# Patient Record
Sex: Male | Born: 1940 | Race: White | Hispanic: No | Marital: Married | Smoking: Never smoker
Health system: Southern US, Community
[De-identification: ages and names within clinical notes are randomized; demographics above are authoritative.]

## PROBLEM LIST (undated history)

## (undated) DIAGNOSIS — C801 Malignant (primary) neoplasm, unspecified: Secondary | ICD-10-CM

## (undated) DIAGNOSIS — Z972 Presence of dental prosthetic device (complete) (partial): Secondary | ICD-10-CM

## (undated) DIAGNOSIS — R531 Weakness: Secondary | ICD-10-CM

## (undated) DIAGNOSIS — Z8669 Personal history of other diseases of the nervous system and sense organs: Secondary | ICD-10-CM

## (undated) DIAGNOSIS — H9190 Unspecified hearing loss, unspecified ear: Secondary | ICD-10-CM

## (undated) DIAGNOSIS — Z974 Presence of external hearing-aid: Secondary | ICD-10-CM

## (undated) DIAGNOSIS — K219 Gastro-esophageal reflux disease without esophagitis: Secondary | ICD-10-CM

## (undated) DIAGNOSIS — Z86011 Personal history of benign neoplasm of the brain: Secondary | ICD-10-CM

## (undated) DIAGNOSIS — K2270 Barrett's esophagus without dysplasia: Secondary | ICD-10-CM

## (undated) DIAGNOSIS — K635 Polyp of colon: Secondary | ICD-10-CM

## (undated) DIAGNOSIS — R413 Other amnesia: Secondary | ICD-10-CM

## (undated) DIAGNOSIS — M1990 Unspecified osteoarthritis, unspecified site: Secondary | ICD-10-CM

## (undated) DIAGNOSIS — L570 Actinic keratosis: Secondary | ICD-10-CM

## (undated) DIAGNOSIS — I10 Essential (primary) hypertension: Secondary | ICD-10-CM

## (undated) DIAGNOSIS — H9319 Tinnitus, unspecified ear: Secondary | ICD-10-CM

## (undated) HISTORY — PX: BRAIN SURGERY: SHX531

## (undated) HISTORY — PX: PROSTATE SURGERY: SHX751

## (undated) HISTORY — PX: TOTAL KNEE ARTHROPLASTY: SHX125

## (undated) HISTORY — PX: EYE SURGERY: SHX253

---

## 2007-09-18 ENCOUNTER — Ambulatory Visit: Payer: Self-pay | Admitting: Internal Medicine

## 2007-10-02 ENCOUNTER — Ambulatory Visit: Payer: Self-pay | Admitting: Family Medicine

## 2013-06-14 DIAGNOSIS — D320 Benign neoplasm of cerebral meninges: Secondary | ICD-10-CM | POA: Insufficient documentation

## 2013-06-14 DIAGNOSIS — I10 Essential (primary) hypertension: Secondary | ICD-10-CM | POA: Insufficient documentation

## 2013-06-14 DIAGNOSIS — M1710 Unilateral primary osteoarthritis, unspecified knee: Secondary | ICD-10-CM | POA: Insufficient documentation

## 2013-06-14 DIAGNOSIS — K635 Polyp of colon: Secondary | ICD-10-CM | POA: Insufficient documentation

## 2013-06-14 DIAGNOSIS — C61 Malignant neoplasm of prostate: Secondary | ICD-10-CM | POA: Insufficient documentation

## 2013-06-14 DIAGNOSIS — K2270 Barrett's esophagus without dysplasia: Secondary | ICD-10-CM | POA: Insufficient documentation

## 2013-08-23 DIAGNOSIS — R7301 Impaired fasting glucose: Secondary | ICD-10-CM | POA: Insufficient documentation

## 2013-08-23 DIAGNOSIS — E785 Hyperlipidemia, unspecified: Secondary | ICD-10-CM | POA: Insufficient documentation

## 2013-08-23 DIAGNOSIS — Z0000 Encounter for general adult medical examination without abnormal findings: Secondary | ICD-10-CM | POA: Insufficient documentation

## 2014-02-21 DIAGNOSIS — J309 Allergic rhinitis, unspecified: Secondary | ICD-10-CM | POA: Insufficient documentation

## 2016-09-09 DIAGNOSIS — K2100 Gastro-esophageal reflux disease with esophagitis, without bleeding: Secondary | ICD-10-CM | POA: Insufficient documentation

## 2017-07-05 DIAGNOSIS — Z86018 Personal history of other benign neoplasm: Secondary | ICD-10-CM | POA: Insufficient documentation

## 2020-01-28 DIAGNOSIS — I639 Cerebral infarction, unspecified: Secondary | ICD-10-CM

## 2020-01-28 HISTORY — DX: Cerebral infarction, unspecified: I63.9

## 2020-04-12 DIAGNOSIS — R399 Unspecified symptoms and signs involving the genitourinary system: Secondary | ICD-10-CM | POA: Insufficient documentation

## 2020-06-28 DIAGNOSIS — I6930 Unspecified sequelae of cerebral infarction: Secondary | ICD-10-CM | POA: Insufficient documentation

## 2020-10-30 DIAGNOSIS — H539 Unspecified visual disturbance: Secondary | ICD-10-CM | POA: Insufficient documentation

## 2020-10-30 DIAGNOSIS — F329 Major depressive disorder, single episode, unspecified: Secondary | ICD-10-CM | POA: Insufficient documentation

## 2020-10-30 DIAGNOSIS — F064 Anxiety disorder due to known physiological condition: Secondary | ICD-10-CM | POA: Insufficient documentation

## 2021-03-27 ENCOUNTER — Ambulatory Visit
Admission: EM | Admit: 2021-03-27 | Discharge: 2021-03-27 | Disposition: A | Discharge status: Home / Self Care | Payer: Medicare HMO | Attending: Family Medicine | Admitting: Family Medicine

## 2021-03-27 ENCOUNTER — Encounter: Payer: Self-pay | Admitting: Emergency Medicine

## 2021-03-27 ENCOUNTER — Other Ambulatory Visit: Payer: Self-pay

## 2021-03-27 DIAGNOSIS — K4090 Unilateral inguinal hernia, without obstruction or gangrene, not specified as recurrent: Secondary | ICD-10-CM | POA: Diagnosis not present

## 2021-03-27 NOTE — ED Provider Notes (Signed)
[facility]    CSN: [id number] Arrival date & time: 03/27/21  1934  History   Chief Complaint Chief Complaint  Patient presents with   Groin Pain    HPI 80 year old male presents with the above complaint.  Patient reports intermittent swelling and pain of the left groin. Wife states that he complained of pain today after doing yard work. No fall, trauma or injury. Denies any fever or other associated symptoms.   Home Medications    Prior to Admission medications   Medication Sig Start Date End Date Taking? Authorizing Provider  escitalopram (LEXAPRO) 10 MG tablet Take 1 tablet by mouth daily. 12/18/20  Yes [provider]  finasteride (PROSCAR) 5 MG tablet  08/16/20  Yes [provider]  lisinopril (ZESTRIL) 40 MG tablet Take 1 tablet by mouth daily. 11/27/20  Yes [provider]  metFORMIN (GLUCOPHAGE-XR) 750 MG 24 hr tablet Take by mouth. 08/27/20  Yes [provider]  simvastatin (ZOCOR) 40 MG tablet Take 1 tablet by mouth at bedtime. 03/30/15  Yes [provider]  solifenacin (VESICARE) 5 MG tablet Take by mouth. 11/26/20  Yes [provider]  Cholecalciferol 25 MCG (1000 UT) tablet Take by mouth.    [provider]  cyanocobalamin 1000 MCG tablet Take by mouth.    [provider]  omeprazole (PRILOSEC) 20 MG capsule Take by mouth.    [provider]   Social History Social History   Tobacco Use   Smoking status: Never   Smokeless tobacco: Never  Vaping Use   Vaping Use: Never used  Substance Use Topics   Alcohol use: Not Currently   Drug use: Not Currently     Allergies   Codeine   Review of Systems Review of Systems Per HPI  Physical Exam Triage Vital Signs ED Triage Vitals  Enc Vitals Group     BP 03/27/21 1951 119/82     Pulse Rate 03/27/21 1951 68     Resp 03/27/21 1951 18     Temp 03/27/21 1951 98.1 F (36.7 C)     Temp Source 03/27/21 1951 Oral     SpO2  03/27/21 1951 96 %     Weight 03/27/21 1948 170 lb (77.1 kg)     Height --      Head Circumference --      Peak Flow --      Pain Score 03/27/21 1948 0     Pain Loc --      Pain Edu? --      Excl. in [place]? --    Updated Vital Signs BP 119/82 (BP Location: Left Arm)   Pulse 68   Temp 98.1 F (36.7 C) (Oral)   Resp 18   Wt 77.1 kg   SpO2 96%   Visual Acuity Right Eye Distance:   Left Eye Distance:   Bilateral Distance:    Right Eye Near:   Left Eye Near:    Bilateral Near:     Physical Exam Vitals and nursing note reviewed.  Constitutional:      General: He is not in acute distress.    Appearance: Normal appearance.  HENT:     Head: Normocephalic and atraumatic.  Pulmonary:     Effort: Pulmonary effort is normal. No respiratory distress.  Genitourinary:    Comments: Reducible left inguinal hernia. Neurological:     Mental Status: He is alert.  Psychiatric:        Mood and Affect:  Mood normal.        Behavior: Behavior normal.     UC Treatments / Results  Labs (all labs ordered are listed, but only abnormal results are displayed) Labs Reviewed - No data to display  EKG   Radiology No results found.  Procedures Procedures (including critical care time)  Medications Ordered in UC Medications - No data to display  Initial Impression / Assessment and Plan / UC Course  I have reviewed the triage vital signs and the nursing notes.  Pertinent labs & imaging results that were available during my care of the patient were reviewed by me and considered in my medical decision making (see chart for details).    80 year old male presents with a left inguinal hernia. Referring to general surgery.   Final Clinical Impressions(s) / UC Diagnoses   Final diagnoses:  Reducible left inguinal hernia     Discharge Instructions      I have put a referral to general surgery.  No heavy lifting.   Take it easy!  Dr. Lacinda Axon    ED Prescriptions   None     PDMP not reviewed this encounter.   Coral Spikes, [place] 03/27/21 2256

## 2021-03-27 NOTE — Discharge Instructions (Addendum)
I have put a referral to general surgery.  No heavy lifting.   Take it easy!  Dr. Lacinda Axon

## 2021-03-27 NOTE — ED Triage Notes (Signed)
Pt presents with his wife with c/o of groin pain intermittently x 3 months. Denies dysuria or n/vd.

## 2021-04-02 ENCOUNTER — Telehealth: Payer: Self-pay | Admitting: Surgery

## 2021-04-02 ENCOUNTER — Other Ambulatory Visit: Payer: Self-pay

## 2021-04-02 ENCOUNTER — Ambulatory Visit (INDEPENDENT_AMBULATORY_CARE_PROVIDER_SITE_OTHER): Payer: Medicare HMO | Admitting: Surgery

## 2021-04-02 ENCOUNTER — Ambulatory Visit: Payer: Self-pay | Admitting: Surgery

## 2021-04-02 ENCOUNTER — Encounter: Payer: Self-pay | Admitting: Surgery

## 2021-04-02 VITALS — BP 147/77 | HR 71 | Temp 98.4°F | Ht 67.0 in | Wt 168.8 lb

## 2021-04-02 DIAGNOSIS — K4090 Unilateral inguinal hernia, without obstruction or gangrene, not specified as recurrent: Secondary | ICD-10-CM

## 2021-04-02 NOTE — Telephone Encounter (Signed)
Outgoing call is made, spoke with wife, Festus Holts. They have been advised of Pre-Admission date/time, COVID Testing date and Surgery date.  Surgery Date: 04/10/21 Preadmission Testing Date: 04/04/21 (phone 8a-1p) Covid Testing Date: Not needed.    Patient has been made aware to call [phone], between 1-3:00pm the day before surgery, to find out what time to arrive for surgery.

## 2021-04-02 NOTE — H&P (View-Only) (Signed)
Patient ID: Jamie Mccoy, male   DOB: [birth year], 80 y.o.   MRN: [id number]  Chief Complaint: Left inguinal hernia  History of Present Illness Jamie Mccoy is a 80 y.o. male with a left groin bulge noted earlier this year, getting progressively larger.  Seems to be exacerbated by activity, standing, mowing, lifting.  Has pain at times.  Pain improves with recumbent positioning.  Denies any history of nausea, vomiting, fevers chills or changes in bowel activity.  Past Medical History Past Medical History:  Diagnosis Date   Stroke ([place]) 01/2020      Past Surgical History:  Procedure Laterality Date   BRAIN SURGERY     EYE SURGERY     PROSTATE SURGERY     TOTAL KNEE ARTHROPLASTY Left     Allergies  Allergen Reactions   Codeine Other (See Comments)    confusion    Current Outpatient Medications  Medication Sig Dispense Refill   Cholecalciferol 25 MCG (1000 UT) tablet Take by mouth.     cyanocobalamin 1000 MCG tablet Take by mouth.     escitalopram (LEXAPRO) 10 MG tablet Take 1 tablet by mouth daily.     finasteride (PROSCAR) 5 MG tablet      lisinopril (ZESTRIL) 40 MG tablet Take 1 tablet by mouth daily.     metFORMIN (GLUCOPHAGE-XR) 750 MG 24 hr tablet Take by mouth.     omeprazole (PRILOSEC) 20 MG capsule Take by mouth.     simvastatin (ZOCOR) 40 MG tablet Take 1 tablet by mouth at bedtime.     solifenacin (VESICARE) 5 MG tablet Take by mouth.     No current facility-administered medications for this visit.    Family History History reviewed. No pertinent family history.    Social History Social History   Tobacco Use   Smoking status: Never   Smokeless tobacco: Never  Vaping Use   Vaping Use: Never used  Substance Use Topics   Alcohol use: Not Currently   Drug use: Not Currently        Review of Systems  Constitutional: Negative.   HENT:  Positive for hearing loss.   Eyes: Negative.   Respiratory: Negative.    Cardiovascular: Negative.    Gastrointestinal: Negative.   Genitourinary: Negative.   Skin: Negative.   Neurological:  Positive for speech change.  Psychiatric/Behavioral:  Positive for depression.      Physical Exam Blood pressure (!) 147/77, pulse 71, temperature 98.4 F (36.9 C), temperature source Oral, height 5\' 7"  (1.702 m), weight 168 lb 12.8 oz (76.6 kg), SpO2 97 %. Last Weight  Most recent update: 04/02/2021 10:18 AM    Weight  76.6 kg (168 lb 12.8 oz)             CONSTITUTIONAL: Well developed, and nourished, appropriately responsive and aware without distress.   EYES: Sclera non-icteric.   EARS, NOSE, MOUTH AND THROAT: Mask worn.  There is a well-defined scar of the left parietal scalp.     Hearing is intact to voice.  NECK: Trachea is midline, and there is no jugular venous distension.  LYMPH NODES:  Lymph nodes in the neck are not enlarged. RESPIRATORY:  Lungs are clear, and breath sounds are equal bilaterally. Normal respiratory effort without pathologic use of accessory muscles. CARDIOVASCULAR: Heart is regular in rate and rhythm. GI: The abdomen is without scars, soft, nontender, and nondistended. There were no palpable masses. I did not appreciate hepatosplenomegaly. There were normal bowel sounds. GU: Left inguinal hernia, reducible.  No appreciable right groin bulge. MUSCULOSKELETAL:  Symmetrical muscle tone appreciated in all four extremities.    SKIN: Skin turgor is normal. No pathologic skin lesions appreciated.  NEUROLOGIC:  Motor and sensation appear grossly nonfocal.  He is slightly slow in speech, but appropriate and engaged. PSYCH:  Alert and oriented to person, place and time. Affect is appropriate for situation.  Data Reviewed I have personally reviewed what is currently available of the patient's imaging, recent labs and medical records.   Labs:  No flowsheet data found. No flowsheet data found.    Imaging:  Within last 24 hrs: No results found.  Assessment    Left  inguinal hernia. There are no problems to display for this patient.   Plan    Robotic left inguinal hernia repair. I discussed possibility of incarceration, strangulation, enlargement in size over time, and the need for emergency surgery in the face of these.  Also reviewed the techniques of reduction should incarceration occur, and when unsuccessful to present to the ED.  Also discussed that surgery risks include recurrence which can be up to 30% in the case of complex hernias, use of prosthetic materials (mesh) and the increased risk of infection and the possible need for re-operation and removal of mesh, possibility of post-op SBO or ileus, and the risks of general anesthetic including heart attack, stroke, sudden death or some reaction to anesthetic medications. The patient, and those present, appear to understand the risks, any and all questions were answered to the patient's satisfaction.  No guarantees were ever expressed or implied.   Face-to-face time spent with the patient and accompanying care providers(if present) was 30 minutes, with more than 50% of the time spent counseling, educating, and coordinating care of the patient.    These notes generated with voice recognition software. I apologize for typographical errors.  Ronny Bacon M.D., FACS 04/02/2021, 10:39 AM

## 2021-04-02 NOTE — Patient Instructions (Addendum)
Our surgery scheduler Pamala Hurry will call you within 24-48 hours to get you scheduled. If you have not heard from her after 48 hours, please call our office. You will not need to get Covid tested before surgery and have the blue sheet available when she calls to write down important information.   If you have any concerns or questions, please feel free to call our office.    Laparoscopic Inguinal Hernia Repair, Adult Laparoscopic inguinal hernia repair is a surgical procedure to repair a small, weak spot in the groin muscles that allows fat or intestines from inside the abdomen to bulge out (inguinal hernia). This procedure may be planned, or it may be an emergency procedure. During the procedure, tissue that has bulged out is moved back into place, and the opening in the groin muscles is repaired. This is done through three small incisions in the abdomen. A thin tube with a light and camera on the end (laparoscope) is used to help perform the procedure. Tell a health care provider about: Any allergies you have. All medicines you are taking, including vitamins, herbs, eye drops, creams, and over-the-counter medicines. Any problems you or family members have had with anesthetic medicines. Any blood disorders you have. Any surgeries you have had. Any medical conditions you have. Whether you are pregnant or may be pregnant. What are the risks? Generally, this is a safe procedure. However, problems may occur, including: Infection. Bleeding. Allergic reactions to medicines. Damage to nearby structures or organs. Testicle damage or long-term pain and swelling of the scrotum, in males. Inability to completely empty the bladder (urinary retention). Blood clots. A collection of fluid that builds up under the skin (seroma). The hernia coming back (recurrence). What happens before the procedure? Staying hydrated Follow instructions from your health care provider about hydration, which may  include: Up to 2 hours before the procedure - you may continue to drink clear liquids, such as water, clear fruit juice, black coffee, and plain tea.  Eating and drinking restrictions Follow instructions from your health care provider about eating and drinking, which may include: 8 hours before the procedure - stop eating heavy meals or foods, such as meat, fried foods, or fatty foods. 6 hours before the procedure - stop eating light meals or foods, such as toast or cereal. 6 hours before the procedure - stop drinking milk or drinks that contain milk. 2 hours before the procedure - stop drinking clear liquids. Medicines Ask your health care provider about: Changing or stopping your regular medicines. This is especially important if you are taking diabetes medicines or blood thinners. Taking medicines such as aspirin and ibuprofen. These medicines can thin your blood. Do not take these medicines unless your health care provider tells you to take them. Taking over-the-counter medicines, vitamins, herbs, and supplements. General instructions Do not use any products that contain nicotine or tobacco for at least 4 weeks before the procedure, if possible. These products include cigarettes, chewing tobacco, and vaping devices, such as e-cigarettes. If you need help quitting, ask your health care provider. Ask your health care provider: How your surgery site will be marked. What steps will be taken to help prevent infection. These steps may include: Removing hair at the surgery site. Washing skin with a germ-killing soap. Taking antibiotic medicine. Plan to have a responsible adult take you home from the hospital or clinic. Plan to have a responsible adult care for you for the time you are told after you leave the hospital or  clinic. This is important. What happens during the procedure? An IV will be inserted into one of your veins. You will be given one or more of the following: A medicine to  help you relax (sedative). A medicine to make you fall asleep (general anesthetic). Three small incisions will be made in your abdomen. Your abdomen will be inflated with carbon dioxide gas to make the surgical area easier to see. A laparoscope and surgical instruments will be inserted through the incisions. The laparoscope will send images of the inside of your abdomen to a monitor in the room. Tissue that is bulging through the hernia may be removed or moved back into place. The hernia opening will be closed with a sheet of surgical mesh. The surgical instruments and laparoscope will be removed. Your incisions will be closed with stitches (sutures) and adhesive strips. A bandage (dressing) will be placed over your incisions. The procedure may vary among health care providers and hospitals. What happens after the procedure? Your blood pressure, heart rate, breathing rate, and blood oxygen level will be monitored until you leave the hospital or clinic. You will be given pain medicine as needed. You may continue to receive medicines and fluids through an IV. The IV will be removed after you can drink fluids. You will be encouraged to get up and move around and to take deep breaths frequently. If you were given a sedative during the procedure, it can affect you for several hours. Do not drive or operate machinery until your health care provider says that it is safe. Summary Laparoscopic inguinal hernia repair is a surgical procedure to repair a small, weak spot in the groin muscles that allows fat or intestines from inside the abdomen to bulge out (inguinal hernia). This procedure is done through three small incisions in the abdomen. A thin tube with a light and camera on the end (laparoscope) is used to help perform the procedure. After the procedure, you will be encouraged to get up and move around and to take deep breaths frequently. This information is not intended to replace advice given to  you by your health care provider. Make sure you discuss any questions you have with your healthcare provider. Document Revised: 05/15/2020 Document Reviewed: 05/15/2020 Elsevier Patient Education  2022 [employer].   Inguinal Hernia, Adult An inguinal hernia is when fat or your intestines push through a weak spot in a muscle where your leg meets your lower belly (groin). This causes a bulge. This kind of hernia could also be: In your scrotum, if you are male. In folds of skin around your vagina, if you are male. There are three types of inguinal hernias: Hernias that can be pushed back into the belly (are reducible). This type rarely causes pain. Hernias that cannot be pushed back into the belly (are incarcerated). Hernias that cannot be pushed back into the belly and lose their blood supply (are strangulated). This type needs emergency surgery. What are the causes? This condition is caused by having a weak spot in the muscles or tissues in your groin. This develops over time. The hernia may poke through the weak spot when you strain your lower belly muscles all of a sudden, such as when you: Lift a heavy object. Strain to poop (have a bowel movement). Trouble pooping (constipation) can lead to straining. Cough. What increases the risk? This condition is more likely to develop in: Males. Pregnant females. People who: Are overweight. Work in jobs that require long periods of  standing or heavy lifting. Have had an inguinal hernia before. Smoke or have lung disease. These factors can lead to long-term (chronic) coughing. What are the signs or symptoms? Symptoms may depend on the size of the hernia. Often, a small hernia has no symptoms. Symptoms of a larger hernia may include: A bulge in the groin area. This is easier to see when standing. You might not be able to see it when you are lying down. Pain or burning in the groin. This may get worse when you lift, strain, or cough. A dull  ache or a feeling of pressure in the groin. An abnormal bulge in the scrotum, in males. Symptoms of a strangulated inguinal hernia may include: A bulge in your groin that is very painful and tender to the touch. A bulge that turns red or purple. Fever, feeling like you may vomit (nausea), and vomiting. Not being able to poop or to pass gas. How is this treated? Treatment depends on the size of your hernia and whether you have symptoms. If you do not have symptoms, your doctor may have you watch your hernia carefully and have you come in for follow-up visits. If your hernia is large or if youhave symptoms, you may need surgery to repair the hernia. Follow these instructions at home: Lifestyle Avoid lifting heavy objects. Avoid standing for long amounts of time. Do not smoke or use any products that contain nicotine or tobacco. If you need help quitting, ask your doctor. Stay at a healthy weight. Prevent trouble pooping You may need to take these actions to prevent or treat trouble pooping: Drink enough fluid to keep your pee (urine) pale yellow. Take over-the-counter or prescription medicines. Eat foods that are high in fiber. These include beans, whole grains, and fresh fruits and vegetables. Limit foods that are high in fat and sugar. These include fried or sweet foods. General instructions You may try to push your hernia back in place by very gently pressing on it when you are lying down. Do not try to push the bulge back in if it will not go in easily. Watch your hernia for any changes in shape, size, or color. Tell your doctor if you see any changes. Take over-the-counter and prescription medicines only as told by your doctor. Keep all follow-up visits. Contact a doctor if: You have a fever or chills. You have new symptoms. Your symptoms get worse. Get help right away if: You have pain in your groin that gets worse all of a sudden. You have a bulge in your groin that: Gets bigger  all of a sudden, and it does not get smaller after that. Turns red or purple. Is painful when you touch it. You are a male, and you have: Sudden pain in your scrotum. A sudden change in the size of your scrotum. You cannot push the hernia back in place by very gently pressing on it when you are lying down. You feel like you may vomit, and that feeling does not go away. You keep vomiting. You have a fast heartbeat. You cannot poop or pass gas. These symptoms may be an emergency. Get help right away. Call your local emergency services (911 in the [place] Do not wait to see if the symptoms will go away. Do not drive yourself to the hospital. Summary An inguinal hernia is when fat or your intestines push through a weak spot in a muscle where your leg meets your lower belly (groin). This causes a bulge. If  you do not have symptoms, you may not need treatment. If you have symptoms or a large hernia, you may need surgery. Avoid lifting heavy objects. Also, avoid standing for long amounts of time. Do not try to push the bulge back in if it will not go in easily. This information is not intended to replace advice given to you by your health care provider. Make sure you discuss any questions you have with your healthcare provider. Document Revised: 05/15/2020 Document Reviewed: 05/15/2020 Elsevier Patient Education  2022 [employer].

## 2021-04-02 NOTE — Progress Notes (Signed)
Patient ID: Jamie Mccoy, male   DOB: 07-01-1941, 80 y.o.   MRN: [id number]  Chief Complaint: Left inguinal hernia  History of Present Illness Jamie Mccoy is a 80 y.o. male with a left groin bulge noted earlier this year, getting progressively larger.  Seems to be exacerbated by activity, standing, mowing, lifting.  Has pain at times.  Pain improves with recumbent positioning.  Denies any history of nausea, vomiting, fevers chills or changes in bowel activity.  Past Medical History Past Medical History:  Diagnosis Date   Stroke ([place]) 01/2020      Past Surgical History:  Procedure Laterality Date   BRAIN SURGERY     EYE SURGERY     PROSTATE SURGERY     TOTAL KNEE ARTHROPLASTY Left     Allergies  Allergen Reactions   Codeine Other (See Comments)    confusion    Current Outpatient Medications  Medication Sig Dispense Refill   Cholecalciferol 25 MCG (1000 UT) tablet Take by mouth.     cyanocobalamin 1000 MCG tablet Take by mouth.     escitalopram (LEXAPRO) 10 MG tablet Take 1 tablet by mouth daily.     finasteride (PROSCAR) 5 MG tablet      lisinopril (ZESTRIL) 40 MG tablet Take 1 tablet by mouth daily.     metFORMIN (GLUCOPHAGE-XR) 750 MG 24 hr tablet Take by mouth.     omeprazole (PRILOSEC) 20 MG capsule Take by mouth.     simvastatin (ZOCOR) 40 MG tablet Take 1 tablet by mouth at bedtime.     solifenacin (VESICARE) 5 MG tablet Take by mouth.     No current facility-administered medications for this visit.    Family History History reviewed. No pertinent family history.    Social History Social History   Tobacco Use   Smoking status: Never   Smokeless tobacco: Never  Vaping Use   Vaping Use: Never used  Substance Use Topics   Alcohol use: Not Currently   Drug use: Not Currently        Review of Systems  Constitutional: Negative.   HENT:  Positive for hearing loss.   Eyes: Negative.   Respiratory: Negative.    Cardiovascular: Negative.    Gastrointestinal: Negative.   Genitourinary: Negative.   Skin: Negative.   Neurological:  Positive for speech change.  Psychiatric/Behavioral:  Positive for depression.      Physical Exam Blood pressure (!) 147/77, pulse 71, temperature 98.4 F (36.9 C), temperature source Oral, height 5\' 7"  (1.702 m), weight 168 lb 12.8 oz (76.6 kg), SpO2 97 %. Last Weight  Most recent update: 04/02/2021 10:18 AM    Weight  76.6 kg (168 lb 12.8 oz)             CONSTITUTIONAL: Well developed, and nourished, appropriately responsive and aware without distress.   EYES: Sclera non-icteric.   EARS, NOSE, MOUTH AND THROAT: Mask worn.  There is a well-defined scar of the left parietal scalp.     Hearing is intact to voice.  NECK: Trachea is midline, and there is no jugular venous distension.  LYMPH NODES:  Lymph nodes in the neck are not enlarged. RESPIRATORY:  Lungs are clear, and breath sounds are equal bilaterally. Normal respiratory effort without pathologic use of accessory muscles. CARDIOVASCULAR: Heart is regular in rate and rhythm. GI: The abdomen is without scars, soft, nontender, and nondistended. There were no palpable masses. I did not appreciate hepatosplenomegaly. There were normal bowel sounds. GU: Left inguinal hernia, reducible.  No appreciable right groin bulge. MUSCULOSKELETAL:  Symmetrical muscle tone appreciated in all four extremities.    SKIN: Skin turgor is normal. No pathologic skin lesions appreciated.  NEUROLOGIC:  Motor and sensation appear grossly nonfocal.  He is slightly slow in speech, but appropriate and engaged. PSYCH:  Alert and oriented to person, place and time. Affect is appropriate for situation.  Data Reviewed I have personally reviewed what is currently available of the patient's imaging, recent labs and medical records.   Labs:  No flowsheet data found. No flowsheet data found.    Imaging:  Within last 24 hrs: No results found.  Assessment    Left  inguinal hernia. There are no problems to display for this patient.   Plan    Robotic left inguinal hernia repair. I discussed possibility of incarceration, strangulation, enlargement in size over time, and the need for emergency surgery in the face of these.  Also reviewed the techniques of reduction should incarceration occur, and when unsuccessful to present to the ED.  Also discussed that surgery risks include recurrence which can be up to 30% in the case of complex hernias, use of prosthetic materials (mesh) and the increased risk of infection and the possible need for re-operation and removal of mesh, possibility of post-op SBO or ileus, and the risks of general anesthetic including heart attack, stroke, sudden death or some reaction to anesthetic medications. The patient, and those present, appear to understand the risks, any and all questions were answered to the patient's satisfaction.  No guarantees were ever expressed or implied.   Face-to-face time spent with the patient and accompanying care providers(if present) was 30 minutes, with more than 50% of the time spent counseling, educating, and coordinating care of the patient.    These notes generated with voice recognition software. I apologize for typographical errors.  Ronny Bacon M.D., FACS 04/02/2021, 10:39 AM

## 2021-04-04 ENCOUNTER — Other Ambulatory Visit: Payer: Self-pay

## 2021-04-04 ENCOUNTER — Encounter
Admission: RE | Admit: 2021-04-04 | Discharge: 2021-04-04 | Disposition: A | Discharge status: Home / Self Care | Payer: Medicare HMO | Source: Ambulatory Visit | Attending: Surgery | Admitting: Surgery

## 2021-04-04 HISTORY — DX: Unspecified hearing loss, unspecified ear: H91.90

## 2021-04-04 HISTORY — DX: Malignant (primary) neoplasm, unspecified: C80.1

## 2021-04-04 HISTORY — DX: Barrett's esophagus without dysplasia: K22.70

## 2021-04-04 HISTORY — DX: Gastro-esophageal reflux disease without esophagitis: K21.9

## 2021-04-04 HISTORY — DX: Personal history of benign neoplasm of the brain: Z86.011

## 2021-04-04 HISTORY — DX: Essential (primary) hypertension: I10

## 2021-04-04 HISTORY — DX: Personal history of other diseases of the nervous system and sense organs: Z86.69

## 2021-04-04 HISTORY — DX: Tinnitus, unspecified ear: H93.19

## 2021-04-04 HISTORY — DX: Polyp of colon: K63.5

## 2021-04-04 HISTORY — DX: Actinic keratosis: L57.0

## 2021-04-04 HISTORY — DX: Unspecified osteoarthritis, unspecified site: M19.90

## 2021-04-04 NOTE — Patient Instructions (Addendum)
Your procedure is scheduled on: April 10, 2021  [facility] Report to the Registration Desk on the 1st floor of the [facility]. To find out your arrival time, please call [phone] between 1PM - 3PM on: Tuesday April 09, 2021  REMEMBER: Instructions that are not followed completely may result in serious medical risk, up to and including death; or upon the discretion of your surgeon and anesthesiologist your surgery may need to be rescheduled.  DO NOT EAT OR DRINK after midnight the night before surgery.  No gum chewing, lozengers or hard candies.  TAKE THESE MEDICATIONS THE MORNING OF SURGERY WITH A SIP OF WATER: ESCITALOPRAM FINASTERIDE SOLIFENACIN OMEPRAZOLE (take one the night before and one on the morning of surgery - helps to prevent nausea after surgery.)  DO NOT TAKE LISINOPRIL MORNING OF SURGERY  Stop Metformin 2 days prior to surgery. LAST DOSE 04/07/2021 SUNDAY  One week prior to surgery: Stop Anti-inflammatories (NSAIDS) such as Advil, Aleve, Ibuprofen, Motrin, Naproxen, Naprosyn and  ASPIRIN OR Aspirin based products such as Excedrin, Goodys Powder, BC Powder. Stop ANY OVER THE COUNTER supplements until after surgery. You may however, continue to take Tylenol if needed for pain up until the day of surgery.  No Alcohol for 24 hours before or after surgery.  No Smoking including e-cigarettes for 24 hours prior to surgery.  No chewable tobacco products for at least 6 hours prior to surgery.  No nicotine patches on the day of surgery.  Do not use any "recreational" drugs for at least a week prior to your surgery.  Please be advised that the combination of cocaine and anesthesia may have negative outcomes, up to and including death. If you test positive for cocaine, your surgery will be cancelled.  On the morning of surgery brush your teeth with toothpaste and water, you may rinse your mouth with mouthwash if you wish. Do not swallow any toothpaste or mouthwash.  Do  not wear jewelry, make-up, hairpins, clips or nail polish.  Do not wear lotions, powders, or perfumes OR DEODORANT  Do not shave body from the neck down 48 hours prior to surgery just in case you cut yourself which could leave a site for infection.  Also, freshly shaved skin may become irritated if using the CHG soap.  Contact lenses, hearing aids and dentures may not be worn into surgery.  Do not bring valuables to the hospital. [facility] is not responsible for any missing/lost belongings or valuables.   Use CHG Soap  as directed on instruction sheet.  Notify your doctor if there is any change in your medical condition (cold, fever, infection).  Wear comfortable clothing (specific to your surgery type) to the hospital.  After surgery, you can help prevent lung complications by doing breathing exercises.  Take deep breaths and cough every 1-2 hours. Your doctor may order a device called an Incentive Spirometer to help you take deep breaths. When coughing or sneezing, hold a pillow firmly against your incision with both hands. This is called "splinting." Doing this helps protect your incision. It also decreases belly discomfort.  If you are being discharged the day of surgery, you will not be allowed to drive home. You will need a responsible adult (18 years or older) to drive you home and stay with you that night.   If you are taking public transportation, you will need to have a responsible adult (18 years or older) with you. Please confirm with your physician that it is acceptable to  use public transportation.   Please call the [place] Dept. at [phone] if you have any questions about these instructions.  Surgery Visitation Policy:  Patients undergoing a surgery or procedure may have one family member or support person with them as long as that person is not COVID-19 positive or experiencing its symptoms.  That person may remain in the waiting area during the  procedure.  Inpatient Visitation:    Visiting hours are 7 a.m. to 8 p.m. Inpatients will be allowed two visitors daily. The visitors may change each day during the patient's stay. No visitors under the age of 4. Any visitor under the age of 28 must be accompanied by an adult. The visitor must pass COVID-19 screenings, use hand sanitizer when entering and exiting the patient's room and wear a mask at all times, including in the patient's room. Patients must also wear a mask when staff or their visitor are in the room. Masking is required regardless of vaccination status.

## 2021-04-08 ENCOUNTER — Other Ambulatory Visit: Payer: Self-pay

## 2021-04-08 ENCOUNTER — Encounter: Payer: Self-pay | Admitting: Urgent Care

## 2021-04-08 ENCOUNTER — Encounter
Admission: RE | Admit: 2021-04-08 | Discharge: 2021-04-08 | Disposition: A | Discharge status: Home / Self Care | Payer: Medicare HMO | Source: Ambulatory Visit | Attending: Surgery | Admitting: Surgery

## 2021-04-08 DIAGNOSIS — Z01812 Encounter for preprocedural laboratory examination: Secondary | ICD-10-CM | POA: Insufficient documentation

## 2021-04-08 LAB — COMPREHENSIVE METABOLIC PANEL
ALT: 25 U/L (ref 0–44)
AST: 21 U/L (ref 15–41)
Albumin: 4.5 g/dL (ref 3.5–5.0)
Alkaline Phosphatase: 53 U/L (ref 38–126)
Anion gap: 6 (ref 5–15)
BUN: 20 mg/dL (ref 8–23)
CO2: 26 mmol/L (ref 22–32)
Calcium: 9.2 mg/dL (ref 8.9–10.3)
Chloride: 103 mmol/L (ref 98–111)
Creatinine, Ser: 0.75 mg/dL (ref 0.61–1.24)
GFR, Estimated: >60 mL/min (ref >60)
Glucose, Bld: 109 mg/dL — ABNORMAL HIGH (ref 70–99)
Potassium: 3.9 mmol/L (ref 3.5–5.1)
Sodium: 135 mmol/L (ref 135–145)
Total Bilirubin: 0.9 mg/dL (ref 0.3–1.2)
Total Protein: 7.4 g/dL (ref 6.5–8.1)

## 2021-04-08 LAB — CBC WITH DIFFERENTIAL/PLATELET
Abs Immature Granulocytes: 0.03 10*3/uL (ref 0.00–0.07)
Basophils Absolute: 0 10*3/uL (ref 0.0–0.1)
Basophils Relative: 1 %
Eosinophils Absolute: 0.2 10*3/uL (ref 0.0–0.5)
Eosinophils Relative: 2 %
HCT: 45.7 % (ref 39.0–52.0)
Hemoglobin: 15.8 g/dL (ref 13.0–17.0)
Immature Granulocytes: 0 %
Lymphocytes Relative: 18 %
Lymphs Abs: 1.4 10*3/uL (ref 0.7–4.0)
MCH: 33.7 pg (ref 26.0–34.0)
MCHC: 34.6 g/dL (ref 30.0–36.0)
MCV: 97.4 fL (ref 80.0–100.0)
Monocytes Absolute: 0.8 10*3/uL (ref 0.1–1.0)
Monocytes Relative: 10 %
Neutro Abs: 5.5 10*3/uL (ref 1.7–7.7)
Neutrophils Relative %: 69 %
Platelets: 175 10*3/uL (ref 150–400)
RBC: 4.69 MIL/uL (ref 4.22–5.81)
RDW: 12.5 % (ref 11.5–15.5)
WBC: 7.9 10*3/uL (ref 4.0–10.5)
nRBC: 0 % (ref 0.0–0.2)

## 2021-04-10 ENCOUNTER — Ambulatory Visit: Payer: Medicare HMO | Admitting: Urgent Care

## 2021-04-10 ENCOUNTER — Ambulatory Visit: Payer: Medicare HMO

## 2021-04-10 ENCOUNTER — Ambulatory Visit
Admission: RE | Admit: 2021-04-10 | Discharge: 2021-04-10 | Disposition: A | Discharge status: Home / Self Care | Payer: Medicare HMO | Attending: Surgery | Admitting: Surgery

## 2021-04-10 ENCOUNTER — Encounter
Admission: RE | Disposition: A | Discharge status: Home / Self Care | Payer: Self-pay | Source: Home / Self Care | Attending: Surgery

## 2021-04-10 DIAGNOSIS — Z8673 Personal history of transient ischemic attack (TIA), and cerebral infarction without residual deficits: Secondary | ICD-10-CM | POA: Insufficient documentation

## 2021-04-10 DIAGNOSIS — K4020 Bilateral inguinal hernia, without obstruction or gangrene, not specified as recurrent: Secondary | ICD-10-CM | POA: Diagnosis present

## 2021-04-10 DIAGNOSIS — R2990 Unspecified symptoms and signs involving the nervous system: Secondary | ICD-10-CM

## 2021-04-10 DIAGNOSIS — Z7984 Long term (current) use of oral hypoglycemic drugs: Secondary | ICD-10-CM | POA: Insufficient documentation

## 2021-04-10 DIAGNOSIS — Z79899 Other long term (current) drug therapy: Secondary | ICD-10-CM | POA: Insufficient documentation

## 2021-04-10 DIAGNOSIS — G928 Other toxic encephalopathy: Secondary | ICD-10-CM | POA: Diagnosis not present

## 2021-04-10 DIAGNOSIS — Z885 Allergy status to narcotic agent status: Secondary | ICD-10-CM | POA: Insufficient documentation

## 2021-04-10 DIAGNOSIS — R29810 Facial weakness: Secondary | ICD-10-CM | POA: Diagnosis not present

## 2021-04-10 DIAGNOSIS — Z96652 Presence of left artificial knee joint: Secondary | ICD-10-CM | POA: Insufficient documentation

## 2021-04-10 DIAGNOSIS — K4090 Unilateral inguinal hernia, without obstruction or gangrene, not specified as recurrent: Secondary | ICD-10-CM

## 2021-04-10 HISTORY — PX: XI ROBOTIC ASSISTED INGUINAL HERNIA REPAIR WITH MESH: SHX6706

## 2021-04-10 LAB — GLUCOSE, CAPILLARY
Glucose-Capillary: 119 mg/dL — ABNORMAL HIGH (ref 70–99)
Glucose-Capillary: 167 mg/dL — ABNORMAL HIGH (ref 70–99)

## 2021-04-10 SURGERY — REPAIR, HERNIA, INGUINAL, ROBOT-ASSISTED, LAPAROSCOPIC, USING MESH
Anesthesia: General | Laterality: Bilateral

## 2021-04-10 MED ORDER — 0.9 % SODIUM CHLORIDE (POUR BTL) OPTIME
TOPICAL | Status: DC | PRN
  Administered 2021-04-10: 250 mL

## 2021-04-10 MED ORDER — EPHEDRINE 5 MG/ML INJ
INTRAVENOUS | Status: AC
  Filled 2021-04-10: qty 10

## 2021-04-10 MED ORDER — GABAPENTIN 300 MG PO CAPS: 300.0000 mg | ORAL_CAPSULE | ORAL | Status: AC

## 2021-04-10 MED ORDER — ROCURONIUM BROMIDE 10 MG/ML (PF) SYRINGE
PREFILLED_SYRINGE | INTRAVENOUS | Status: AC
  Filled 2021-04-10: qty 10

## 2021-04-10 MED ORDER — LIDOCAINE HCL (CARDIAC) PF 100 MG/5ML IV SOSY
PREFILLED_SYRINGE | INTRAVENOUS | Status: DC | PRN
  Administered 2021-04-10: 50 mg via INTRAVENOUS

## 2021-04-10 MED ORDER — FENTANYL CITRATE (PF) 100 MCG/2ML IJ SOLN
INTRAMUSCULAR | Status: DC | PRN
  Administered 2021-04-10 (×2): 50 ug via INTRAVENOUS

## 2021-04-10 MED ORDER — CHLORHEXIDINE GLUCONATE CLOTH 2 % EX PADS: 6.0000 | MEDICATED_PAD | Freq: Once | CUTANEOUS | Status: DC

## 2021-04-10 MED ORDER — FENTANYL CITRATE (PF) 100 MCG/2ML IJ SOLN
INTRAMUSCULAR | Status: AC
  Filled 2021-04-10: qty 2

## 2021-04-10 MED ORDER — CEFAZOLIN SODIUM-DEXTROSE 2-4 GM/100ML-% IV SOLN
2.0000 g | INTRAVENOUS | Status: AC
  Administered 2021-04-10: 2 g via INTRAVENOUS

## 2021-04-10 MED ORDER — ROCURONIUM BROMIDE 100 MG/10ML IV SOLN
INTRAVENOUS | Status: DC | PRN
  Administered 2021-04-10: 60 mg via INTRAVENOUS

## 2021-04-10 MED ORDER — SODIUM CHLORIDE 0.9 % IV SOLN: INTRAVENOUS | Status: DC

## 2021-04-10 MED ORDER — GABAPENTIN 300 MG PO CAPS
ORAL_CAPSULE | ORAL | Status: AC
  Administered 2021-04-10: 300 mg via ORAL
  Filled 2021-04-10: qty 1

## 2021-04-10 MED ORDER — ONDANSETRON HCL 4 MG/2ML IJ SOLN
INTRAMUSCULAR | Status: AC
  Filled 2021-04-10: qty 2

## 2021-04-10 MED ORDER — LACTATED RINGERS IV SOLN: INTRAVENOUS | Status: DC | PRN

## 2021-04-10 MED ORDER — ONDANSETRON HCL 4 MG/2ML IJ SOLN
INTRAMUSCULAR | Status: DC | PRN
  Administered 2021-04-10: 4 mg via INTRAVENOUS

## 2021-04-10 MED ORDER — BUPIVACAINE LIPOSOME 1.3 % IJ SUSP: 20.0000 mL | Freq: Once | INTRAMUSCULAR | Status: DC

## 2021-04-10 MED ORDER — ONDANSETRON HCL 4 MG/2ML IJ SOLN: 4.0000 mg | Freq: Once | INTRAMUSCULAR | Status: DC | PRN

## 2021-04-10 MED ORDER — ACETAMINOPHEN 500 MG PO TABS
ORAL_TABLET | ORAL | Status: AC
  Administered 2021-04-10: 1000 mg via ORAL
  Filled 2021-04-10: qty 2

## 2021-04-10 MED ORDER — FENTANYL CITRATE (PF) 100 MCG/2ML IJ SOLN: 25.0000 ug | INTRAMUSCULAR | Status: DC | PRN

## 2021-04-10 MED ORDER — CHLORHEXIDINE GLUCONATE 0.12 % MT SOLN
OROMUCOSAL | Status: AC
  Administered 2021-04-10: 15 mL via OROMUCOSAL
  Filled 2021-04-10: qty 15

## 2021-04-10 MED ORDER — BUPIVACAINE-EPINEPHRINE (PF) 0.25% -1:200000 IJ SOLN
INTRAMUSCULAR | Status: DC | PRN
  Administered 2021-04-10: 22 mL via PERINEURAL

## 2021-04-10 MED ORDER — CEFAZOLIN SODIUM-DEXTROSE 2-4 GM/100ML-% IV SOLN
INTRAVENOUS | Status: AC
  Filled 2021-04-10: qty 100

## 2021-04-10 MED ORDER — PROPOFOL 10 MG/ML IV BOLUS
INTRAVENOUS | Status: AC
  Filled 2021-04-10: qty 20

## 2021-04-10 MED ORDER — SUGAMMADEX SODIUM 200 MG/2ML IV SOLN
INTRAVENOUS | Status: DC | PRN
  Administered 2021-04-10 (×2): 100 mg via INTRAVENOUS

## 2021-04-10 MED ORDER — ORAL CARE MOUTH RINSE: 15.0000 mL | Freq: Once | OROMUCOSAL | Status: AC

## 2021-04-10 MED ORDER — CELECOXIB 200 MG PO CAPS
ORAL_CAPSULE | ORAL | Status: AC
  Administered 2021-04-10: 200 mg via ORAL
  Filled 2021-04-10: qty 1

## 2021-04-10 MED ORDER — LIDOCAINE HCL (PF) 2 % IJ SOLN
INTRAMUSCULAR | Status: AC
  Filled 2021-04-10: qty 5

## 2021-04-10 MED ORDER — CELECOXIB 200 MG PO CAPS: 200.0000 mg | ORAL_CAPSULE | ORAL | Status: AC

## 2021-04-10 MED ORDER — PROPOFOL 10 MG/ML IV BOLUS
INTRAVENOUS | Status: DC | PRN
  Administered 2021-04-10: 80 mg via INTRAVENOUS
  Administered 2021-04-10: 20 mg via INTRAVENOUS

## 2021-04-10 MED ORDER — ACETAMINOPHEN 500 MG PO TABS: 1000.0000 mg | ORAL_TABLET | ORAL | Status: AC

## 2021-04-10 MED ORDER — IBUPROFEN 800 MG PO TABS: 800.0000 mg | ORAL_TABLET | Freq: Three times a day (TID) | ORAL | 0 refills | Status: AC | PRN

## 2021-04-10 MED ORDER — CHLORHEXIDINE GLUCONATE 0.12 % MT SOLN: 15.0000 mL | Freq: Once | OROMUCOSAL | Status: AC

## 2021-04-10 MED ORDER — DEXAMETHASONE SODIUM PHOSPHATE 10 MG/ML IJ SOLN
INTRAMUSCULAR | Status: AC
  Filled 2021-04-10: qty 1

## 2021-04-10 MED ORDER — DEXAMETHASONE SODIUM PHOSPHATE 10 MG/ML IJ SOLN
INTRAMUSCULAR | Status: DC | PRN
  Administered 2021-04-10: 10 mg via INTRAVENOUS

## 2021-04-10 MED ORDER — EPHEDRINE SULFATE 50 MG/ML IJ SOLN
INTRAMUSCULAR | Status: DC | PRN
  Administered 2021-04-10: 5 mg via INTRAVENOUS
  Administered 2021-04-10: 7.5 mg via INTRAVENOUS
  Administered 2021-04-10: 5 mg via INTRAVENOUS

## 2021-04-10 SURGICAL SUPPLY — 53 items
ADH SKN CLS APL DERMABOND .7 (GAUZE/BANDAGES/DRESSINGS) ×1
APL PRP STRL LF DISP 70% ISPRP (MISCELLANEOUS) ×1
BLADE CLIPPER SURG (BLADE) ×2 IMPLANT
CANISTER SUCT 1200ML W/VALVE (MISCELLANEOUS) ×2 IMPLANT
CANNULA CAP OBTURATR AIRSEAL 8 (CAP) ×2 IMPLANT
CHLORAPREP W/TINT 26 (MISCELLANEOUS) ×2 IMPLANT
COVER TIP SHEARS 8 DVNC (MISCELLANEOUS) ×1 IMPLANT
COVER TIP SHEARS 8MM DA VINCI (MISCELLANEOUS) ×1
COVER WAND RF STERILE (DRAPES) ×2 IMPLANT
DEFOGGER SCOPE WARMER CLEARIFY (MISCELLANEOUS) ×2 IMPLANT
DERMABOND ADVANCED (GAUZE/BANDAGES/DRESSINGS) ×1
DERMABOND ADVANCED .7 DNX12 (GAUZE/BANDAGES/DRESSINGS) ×1 IMPLANT
DRAPE 3/4 80X56 (DRAPES) ×2 IMPLANT
DRAPE ARM DVNC X/XI (DISPOSABLE) ×3 IMPLANT
DRAPE COLUMN DVNC XI (DISPOSABLE) ×1 IMPLANT
DRAPE DA VINCI XI ARM (DISPOSABLE) ×3
DRAPE DA VINCI XI COLUMN (DISPOSABLE) ×1
ELECT REM PT RETURN 9FT ADLT (ELECTROSURGICAL) ×2
ELECTRODE REM PT RTRN 9FT ADLT (ELECTROSURGICAL) ×1 IMPLANT
GAUZE 4X4 16PLY ~~LOC~~+RFID DBL (SPONGE) ×2 IMPLANT
GLOVE SURG ORTHO LTX SZ7.5 (GLOVE) ×6 IMPLANT
GLOVE SURG SYN 6.5 ES PF (GLOVE) ×2 IMPLANT
GLOVE SURG SYN 6.5 PF PI (GLOVE) IMPLANT
GLOVE SURG UNDER POLY LF SZ6.5 (GLOVE) ×1 IMPLANT
GOWN STRL REUS W/ TWL LRG LVL3 (GOWN DISPOSABLE) ×3 IMPLANT
GOWN STRL REUS W/TWL LRG LVL3 (GOWN DISPOSABLE) ×6
GRASPER SUT TROCAR 14GX15 (MISCELLANEOUS) IMPLANT
IRRIGATION STRYKERFLOW (MISCELLANEOUS) IMPLANT
IRRIGATOR STRYKERFLOW (MISCELLANEOUS)
IV CATH ANGIO 14GX1.88 NO SAFE (IV SOLUTION) ×2 IMPLANT
IV NS 1000ML (IV SOLUTION)
IV NS 1000ML BAXH (IV SOLUTION) IMPLANT
KIT PINK PAD W/HEAD ARE REST (MISCELLANEOUS) ×2
KIT PINK PAD W/HEAD ARM REST (MISCELLANEOUS) ×1 IMPLANT
LABEL OR SOLS (LABEL) ×2 IMPLANT
MANIFOLD NEPTUNE II (INSTRUMENTS) ×2 IMPLANT
MESH 3DMAX 4X6 RT LRG (Mesh General) ×1 IMPLANT
MESH 3DMAX LIGHT 4.1X6.2 LT LR (Mesh General) ×1 IMPLANT
NDL INSUFFLATION 14GA 120MM (NEEDLE) IMPLANT
NEEDLE HYPO 22GX1.5 SAFETY (NEEDLE) ×2 IMPLANT
NEEDLE INSUFFLATION 14GA 120MM (NEEDLE) IMPLANT
PACK LAP CHOLECYSTECTOMY (MISCELLANEOUS) ×2 IMPLANT
SEAL CANN UNIV 5-8 DVNC XI (MISCELLANEOUS) ×2 IMPLANT
SEAL XI 5MM-8MM UNIVERSAL (MISCELLANEOUS) ×2
SET TUBE FILTERED XL AIRSEAL (SET/KITS/TRAYS/PACK) ×2 IMPLANT
SOLUTION ELECTROLUBE (MISCELLANEOUS) ×2 IMPLANT
SUT MNCRL 4-0 (SUTURE) ×2
SUT MNCRL 4-0 27XMFL (SUTURE) ×1
SUT V-LOC 90 ABS DVC 3-0 CL (SUTURE) IMPLANT
SUT VIC AB 0 CT2 27 (SUTURE) ×3 IMPLANT
SUT VLOC 90 S/L VL9 GS22 (SUTURE) ×3 IMPLANT
SUTURE MNCRL 4-0 27XMF (SUTURE) ×1 IMPLANT
TROCAR Z-THREAD FIOS 11X100 BL (TROCAR) IMPLANT

## 2021-04-10 NOTE — Transfer of Care (Signed)
Immediate Anesthesia Transfer of Care Note  Patient: Jamie Mccoy  Procedure(s) Performed: XI ROBOTIC ASSISTED INGUINAL HERNIA REPAIR WITH MESH (Bilateral)  Patient Location: PACU  Anesthesia Type:General  Level of Consciousness: responds to stimulation  Airway & Oxygen Therapy: Patient Spontanous Breathing and Patient connected to face mask oxygen  Post-op Assessment: Report given to RN  Post vital signs: Reviewed and stable  Last Vitals:  Vitals Value Taken Time  BP 148/72 04/10/21 1200  Temp 36.9   Pulse 71 04/10/21 1201  Resp 18 04/10/21 1201  SpO2 98 % 04/10/21 1201  Vitals shown include unvalidated device data.  Last Pain: There were no vitals filed for this visit.       Complications: No notable events documented.

## 2021-04-10 NOTE — Progress Notes (Addendum)
At 1210 patient was noted to have left facial paralysis around left eye and very mild left facial droop.  Dr Christian Mate, Anesthesia and preop RN evaluated patient.  [employer] broadcast assistant spoke with wife and wife reported that he had some changes to left eye since surgery but did not remember it affecting his mouth.  Unable to state age or month.  Mild dysarthria on initial exam.  Unsure if current presentation is at baseline and difficult to assess after anesthesia as well.  1234- code stroke called.   1237-pt in CT with this RN  1244-dr arora in CT to evaluate pt.   Per dr Rory Percy pt to have brain MRI and then will evaluate possible disposition after results from that.      1322- ok to cancel code stroke protocols per dr Rory Percy.  Ekg and other orders in orderset not needed.

## 2021-04-10 NOTE — Consult Note (Signed)
Neurology Consultation  Reason for Consult: Code stroke for left-sided facial weakness Referring Physician: Dr. Colon Branch. Bertell Maria -surgery/anesthesia team at [facility] Patient seen in PACU and CT scanner  CC: Left-sided facial weakness  History is obtained from: Patient, chart, patient's wife  HPI: Jamie Mccoy is a 80 y.o. 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 When I saw the patient in the CT scanner, he was able to answer some questions although he appeared disoriented to his age and place and had left upper and lower facial weakness predominantly left upper facial weakness-which later confirmed with wife appears baseline. No tingling numbness weakness.  No fevers chills shortness of breath or cough.  LKW: Sometime before surgery-unclear tpa given?: no, ICH within the last year and likely baseline left lower motor type weakness-hence no. Premorbid modified Rankin scale (mRS): 2  ROS: Full ROS was performed and is negative except as noted in the HPI.  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   Social History:   reports that he has never smoked. He has never used smokeless tobacco. He reports previous alcohol use. He reports previous drug use.  Medications  Current Facility-Administered Medications:    0.9 %  sodium chloride  infusion, , Intravenous, Continuous, Arita Miss, MD   0.9 %  sodium chloride infusion, , Intravenous, Continuous, Arita Miss, MD   bupivacaine liposome (EXPAREL) 1.3 % injection 266 mg, 20 mL, Infiltration, Once, Ronny Bacon, MD   6 CHG cloth bath night before surgery, , , Once **AND** [START ON 04/11/2021] 6 CHG cloth bath AM of surgery, , , Once **AND** Chlorhexidine Gluconate Cloth 2 % PADS 6 each, 6 each, Topical, Once **AND** Chlorhexidine Gluconate Cloth 2 % PADS 6 each, 6 each, Topical, Once, Ronny Bacon, MD   fentaNYL (SUBLIMAZE) injection 25-50 mcg, 25-50 mcg, Intravenous, Q5 min PRN, Arita Miss, MD   ondansetron [facility]) injection 4 mg, 4 mg, Intravenous, Once PRN, Arita Miss, MD   Exam: Current vital signs: BP (!) 145/77 (BP Location: Left Arm)   Pulse 64   Temp (!) 97.1 F (36.2 C)   Resp (!) 23   SpO2 95%  Vital signs in last 24 hours: Temp:  [96.9 F (36.1 C)-98.4 F (36.9 C)] 97.1 F (36.2 C) (07/13 1300) Pulse Rate:  [64-73] 64 (07/13 1300) Resp:  [18-25] 23 (07/13 1300) BP: (141-148)/(72-91) 145/77 (07/13 1300) SpO2:  [87 %-98 %] 95 % (07/13 1300) General: Awake, alert, in no distress HEENT: Left-sided scar without acute changes CVS: Regular rhythm Respiratory: Breathing well saturating normally on room air Neurological exam Is awake alert in no distress He still appears somewhat confused-still appears to be under effects of waning anesthesia. Able to tell me his name, the fact that  he is in the hospital, got his age wrong, got the month wrong. Speech is mildly dysarthric No gross aphasia Cranial nerves: Right pupil is round and reactive light, left pupil is postsurgical, slightly irregular about 4 mm and nonresponsive to light-and has been such since the cataract surgery according to wife, there is mild left ptosis which is also something that he has had post his meningeal resection surgeries, no restriction of extraocular movements, no visual field  deficits, no gross facial asymmetry although there is a questionable subtle left nasolabial fold weakness.  He is hard of hearing. Motor exam: He is able to raise all 4 extremities against gravity without drift Sensation intact to touch Coordination with no dysmetria NIH stroke scale 1a Level of Conscious.: 0 1b LOC Questions: 2 1c LOC Commands: 0 2 Best Gaze: 0 3 Visual: 0 4 Facial Palsy:  5a Motor Arm - left: 0 5b Motor Arm - Right: 0 6a Motor Leg - Left: 0 6b Motor Leg - Right: 0 7 Limb Ataxia: 0 8 Sensory: 0 9 Best Language: 0 10 Dysarthria: 1 11 Extinct. and Inatten.: 0 TOTAL: 3  Blood pressure systolic 970. Blood glucose 167 fingerstick  Labs I have reviewed labs in epic and the results pertinent to this consultation are:   CBC    Component Value Date/Time   WBC 7.9 04/08/2021 1432   RBC 4.69 04/08/2021 1432   HGB 15.8 04/08/2021 1432   HCT 45.7 04/08/2021 1432   PLT 175 04/08/2021 1432   MCV 97.4 04/08/2021 1432   MCH 33.7 04/08/2021 1432   MCHC 34.6 04/08/2021 1432   RDW 12.5 04/08/2021 1432   LYMPHSABS 1.4 04/08/2021 1432   MONOABS 0.8 04/08/2021 1432   EOSABS 0.2 04/08/2021 1432   BASOSABS 0.0 04/08/2021 1432    CMP     Component Value Date/Time   NA 135 04/08/2021 1432   K 3.9 04/08/2021 1432   CL 103 04/08/2021 1432   CO2 26 04/08/2021 1432   GLUCOSE 109 (H) 04/08/2021 1432   BUN 20 04/08/2021 1432   CREATININE 0.75 04/08/2021 1432   CALCIUM 9.2 04/08/2021 1432   PROT 7.4 04/08/2021 1432   ALBUMIN 4.5 04/08/2021 1432   AST 21 04/08/2021 1432   ALT 25 04/08/2021 1432   ALKPHOS 53 04/08/2021 1432   BILITOT 0.9 04/08/2021 1432   GFRNONAA >60 04/08/2021 1432   Imaging I have reviewed the images obtained: CT-scan of the brain-no acute changes.  No bleed.  Aspects 10.  There is left temporal encephalomalacia underlying the craniotomy and exvacuodilatation of the left temporal horn along with patchy and confluent areas of low-attenuation in the  supratentorial white matter likely secondary to chronic microvascular changes.  Assessment:  80 year old man with above past medical history who underwent bilateral inguinal hernia repair was noted to have worsening of left-sided facial weakness that he has had after his meningioma surgeries, was emergently evaluated as an acute code stroke for the facial weakness. He did have mild ptosis and predominantly lower motor neuron type weakness with inability to raise his eyebrows and some subtle left nasolabial fold flattening-according to wife that his baseline. I do think that his facial weakness might have been exacerbated because of the sedating effects of anesthesia and that is probably what prompted the primary team to act rapidly to activate a code stroke. He did appear little confused but I do not have a good sense of his baseline and given his prior strokes, I would not be  surprised if he had some cognitive deficits at baseline, which again not exacerbated in the setting of recent surgery and anesthesia.  All that said, given his prior complicated neurological history as well as risk factors-I think an MRI at this time would be prudent to rule out an ischemic event.  Impression: Left facial weakness-likely exacerbated in the setting of anesthesia Low suspicion for new stroke but would recommend imaging to rule out. Toxic/metabolic encephalopathy in the setting of prolonged side effects of anesthesia.  Recommendations: MRI brain without contrast If negative, no further neurological work-up If the MRI shows any positive findings, recommendations based on those. Plan discussed with the PACU bedside RN and rapid response RN.  -- Amie Portland, MD Neurologist [facility] Pager: [phone]  ADDENDUM MR Brain completed and reviewed. No stroke. No further neurological work up needed from my perspective. Can f/u with his outpatient neurological team. Plan related to the PACU  RN Inpatient neurology will sign off - please call with questions.  -- Amie Portland, MD Neurologist [facility] Pager: [phone]   CRITICAL CARE ATTESTATION Performed by: Amie Portland, MD Total critical care time: 45 minutes Critical care time was exclusive of separately billable procedures and treating other patients and/or supervising APPs/Residents/Students Critical care was necessary to treat or prevent imminent or life-threatening deterioration due to strokelike symptoms, toxic encephalopathy. This patient is critically ill and at significant risk for neurological worsening and/or death and care requires constant monitoring. Critical care was time spent personally by me on the following activities: development of treatment plan with patient and/or surrogate as well as nursing, discussions with consultants, evaluation of patient's response to treatment, examination of patient, obtaining history from patient or surrogate, ordering and performing treatments and interventions, ordering and review of laboratory studies, ordering and review of radiographic studies, pulse oximetry, re-evaluation of patient's condition, participation in multidisciplinary rounds and medical decision making of high complexity in the care of this patient.

## 2021-04-10 NOTE — Discharge Instructions (Signed)
AMBULATORY SURGERY  ?DISCHARGE INSTRUCTIONS ? ? ?The drugs that you were given will stay in your system until tomorrow so for the next 24 hours you should not: ? ?Drive an automobile ?Make any legal decisions ?Drink any alcoholic beverage ? ? ?You may resume regular meals tomorrow.  Today it is better to start with liquids and gradually work up to solid foods. ? ?You may eat anything you prefer, but it is better to start with liquids, then soup and crackers, and gradually work up to solid foods. ? ? ?Please notify your doctor immediately if you have any unusual bleeding, trouble breathing, redness and pain at the surgery site, drainage, fever, or pain not relieved by medication. ? ? ? ?Additional Instructions: ? ? ? ?Please contact your physician with any problems or Same Day Surgery at [phone], Monday through Friday 6 am to 4 pm, or [place] [facility] [place] Main number at [phone].  ?

## 2021-04-10 NOTE — Anesthesia Preprocedure Evaluation (Signed)
Anesthesia Evaluation  Patient identified by MRN, date of birth, ID band Patient awake  General Assessment Comment:Patient AOx3, but somewhat slow and delayed in his responses. Hx vascular dementia post-stroke   Reviewed: Allergy & Precautions, NPO status , Patient's Chart, lab work & pertinent test results  History of Anesthesia Complications Negative for: history of anesthetic complications  Airway Mallampati: II  TM Distance: >3 FB Neck ROM: Full    Dental no notable dental hx. (+) Teeth Intact   Pulmonary neg pulmonary ROS, neg sleep apnea, neg COPD, Patient abstained from smoking.Not current smoker,    Pulmonary exam normal breath sounds clear to auscultation       Cardiovascular Exercise Tolerance: Good METShypertension, (-) CAD and (-) Past MI (-) dysrhythmias  Rhythm:Regular Rate:Normal - Systolic murmurs    Neuro/Psych PSYCHIATRIC DISORDERS Dementia CVA, Residual Symptoms    GI/Hepatic neg GERD  ,(+)     (-) substance abuse  ,   Endo/Other  neg diabetes  Renal/GU negative Renal ROS     Musculoskeletal  (+) Arthritis ,   Abdominal   Peds  Hematology   Anesthesia Other Findings Past Medical History: No date: Actinic keratosis No date: Arthritis No date: Barrett esophagus No date: Cancer [facility])     Comment:  PROSTATE No date: Colon polyps No date: Gastroesophageal reflux No date: H/O benign neoplasm of cerebral meninges No date: Hard of hearing No date: Hypertension 01/2020: Stroke ([place]) No date: Tinnitus  Reproductive/Obstetrics                             Anesthesia Physical Anesthesia Plan  ASA: 3  Anesthesia Plan: General   Post-op Pain Management:    Induction: Intravenous  PONV Risk Score and Plan: 3 and Ondansetron, Dexamethasone and Treatment may vary due to age or medical condition  Airway Management Planned: Oral ETT  Additional Equipment:  None  Intra-op Plan:   Post-operative Plan: Extubation in OR  Informed Consent: I have reviewed the patients History and Physical, chart, labs and discussed the procedure including the risks, benefits and alternatives for the proposed anesthesia with the patient or authorized representative who has indicated his/her understanding and acceptance.     Dental advisory given  Plan Discussed with: CRNA and Surgeon  Anesthesia Plan Comments: (Discussed risks of anesthesia with patient, including PONV, sore throat, lip/dental damage, Post operative cognitive dysfunction . Rare risks discussed as well, such as cardiorespiratory and neurological sequelae. Patient understands.)        Anesthesia Quick Evaluation

## 2021-04-10 NOTE — Progress Notes (Signed)
Called to PACU by nurse for concerns of patient's left facial paralysis. Left eyebrow and corner of mouth depressed. 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 Discussed with wife and son and safest course would be to activate code stroke to assess.

## 2021-04-10 NOTE — Anesthesia Procedure Notes (Signed)
Procedure Name: Intubation Date/Time: 04/10/2021 10:10 AM Performed by: Loni Dolly, CRNA Pre-anesthesia Checklist: Patient identified, Emergency Drugs available, Suction available and Patient being monitored Patient Re-evaluated:Patient Re-evaluated prior to induction Oxygen Delivery Method: Circle system utilized Preoxygenation: Pre-oxygenation with 100% oxygen Induction Type: IV induction Ventilation: Mask ventilation without difficulty Laryngoscope Size: McGraph and 4 Grade View: Grade I Tube type: Oral Tube size: 7.5 mm Number of attempts: 1 Airway Equipment and Method: Stylet and Oral airway Placement Confirmation: ETT inserted through vocal cords under direct vision, positive ETCO2 and breath sounds checked- equal and bilateral Tube secured with: Tape Dental Injury: Teeth and Oropharynx as per pre-operative assessment

## 2021-04-10 NOTE — Significant Event (Signed)
Rapid Response Event Note   Reason for Call :  Code Stroke  Initial Focused Assessment:  Initially arrived in PACU, but patient was already down in CT. Went immediately to CT where patient was in scanner with PACU nurse present. Per PACU nurse, patient here for inguinal hernia repair and has known history of stroke, but there was uncertainty about whether or not residual symptoms with facial movement and asymmetry along with issues answering were worse than his baseline. In CT, Dr. Rory Percy arrived to personally assess the patient and review imaging. Patient alert, answered some questions appropriately, moved all his extremities. Difficulties moving left eye and answering some questions. See neurologist note for full neuro assessment. Vital signs stable (see vital sign flowsheets) and CBG at 12:11 167.  Interventions:  Patient received non contrast head CT that was reviewed by the neurologist and radiologist.  Plan of Care:  Dr. Rory Percy updating patient's wife on what had occurred. No tPA administration at this time. No further needs from rapid response team at this time.  Event Summary:   MD Notified: Dr. Rory Percy Call Time: 12:38 Arrival Time: 12:39 (PACU), 12:40 (CT) End Time: 13:03  Stephanie Acre, RN

## 2021-04-10 NOTE — Interval H&P Note (Signed)
History and Physical Interval Note:  04/10/2021 9:33 AM  Jamie Mccoy  has presented today for surgery, with the diagnosis of left inguinal hernia.  The various methods of treatment have been discussed with the patient and family. After consideration of risks, benefits and other options for treatment, the patient has consented to  Procedure(s): XI ROBOTIC [place] (Left) as a surgical intervention.  The patient's history has been reviewed, patient examined, no change in status, stable for surgery.  I have reviewed the patient's chart and labs.  Questions were answered to the patient's satisfaction.    Left side is marked.   Ronny Bacon

## 2021-04-10 NOTE — Op Note (Signed)
Robotic assisted Laparoscopic Transabdominal Bilateral Inguinal Hernia Repair with Mesh       Pre-operative Diagnosis:  Bilateral Inguinal Hernias   Post-operative Diagnosis: Same   Procedure: Robotic assisted Laparoscopic  repair of bilateral inguinal hernia(s)   Surgeon: Ronny Bacon, M.D., FACS   Anesthesia: GETA   Findings: Bilateral inguinal hernias,   Procedure Details  The patient was seen again in the Holding Room. The benefits, complications, treatment options, and expected outcomes were discussed with the patient. The risks of bleeding, infection, recurrence of symptoms, failure to resolve symptoms, recurrence of hernia, ischemic orchitis, chronic pain syndrome or neuroma, were reviewed again. The likelihood of improving the patient's symptoms with return to their baseline status is good.  The patient and/or family concurred with the proposed plan, giving informed consent.  The patient was taken to Operating Room, identified  and the procedure verified as Laparoscopic Inguinal Hernia Repair. Laterality confirmed.  A Time Out was held and the above information confirmed.   Prior to the induction of general anesthesia, antibiotic prophylaxis was administered. VTE prophylaxis was in place. General endotracheal anesthesia was then administered and tolerated well. After the induction, the abdomen was prepped with Chloraprep and draped in the sterile fashion. The patient was positioned in the supine position.   After local infiltration of quarter percent Marcaine with epinephrine, stab incision was made left upper quadrant.  Just below the costal margin approximately midclavicular line the Veress needle is passed with sensation of the layers to penetrate the abdominal wall and into the peritoneum.  Saline drop test is confirmed peritoneal placement.  Insufflation is initiated with carbon dioxide to pressures of 15 mmHg. An 8.5 mm port is placed to the left off of the midline, with blunt  tipped trocar.  Pneumoperitoneum maintained w/o HD changes. No evidence of bowel injuries.  Two 8.5 mm ports placed under direct vision. The laparoscopy revealed large bilateral indirect defect(s).   The robot was brought ot the table and docked in the standard fashion, no collision between arms was observed. Instruments were kept under direct view at all times. For bilateral inguinal hernia repair,  I developed a peritoneal flap. The sac(s) were reduced and dissected free from adjacent structures. We preserved the vas and the vessels, and visualized them to their convergence and beyond in the retroperitoneum. Once dissection was completed a large left sided BARD 3D Light mesh was placed and secured at three points with interrupted 0 Vicryl to the pubic tubercle and anteriorly. There was good coverage of the direct, indirect and femoral spaces. A large angiocath is placed under direct visualization in the groin to reduce trapped extraperitoneal air and confirm adequate peritoneal closure.  Second look revealed no complications or injuries. Attention then was turned to the opposite side. The sac was also reduced and dissected free from adjacent structures. We preserved the vas and the vessels, in like manner with adequate posterior dissection. Once dissection was completed the same large, but contralateral 3-D max (not the light) mesh was placed and secured in like manner with interrupted 0 Vicryl. There was good coverage of the direct, indirect and femoral spaces.  The flap was then closed with 2-0 V-lock sutures.  Peritoneal closure completed without defects with residual sutures.  Once assuring that hemostasis was adequate, all needles/sponges removed, and the robot was undocked.  The ports were removed, the abdomen desulflated.  4-0 subcuticular Monocryl was used at all skin edges. Dermabond was placed.  Patient tolerated the procedure well. There were  no complications. He was taken to the recovery  room in stable condition.           Ronny Bacon, M.D., FACS 04/10/2021, 11:59 AM

## 2021-04-10 NOTE — Anesthesia Postprocedure Evaluation (Signed)
Anesthesia Post Note  Patient: Jamie Mccoy  Procedure(s) Performed: XI ROBOTIC ASSISTED INGUINAL HERNIA REPAIR WITH MESH (Bilateral)  Patient location during evaluation: PACU Anesthesia Type: General Level of consciousness: awake and alert Pain management: pain level controlled Vital Signs Assessment: post-procedure vital signs reviewed and stable Respiratory status: spontaneous breathing, nonlabored ventilation, respiratory function stable and patient connected to nasal cannula oxygen Cardiovascular status: blood pressure returned to baseline and stable Postop Assessment: no apparent nausea or vomiting Anesthetic complications: no Comments:  CT and MRI negative for acute stroke. Neurology OK for discharge.   No notable events documented.   Last Vitals:  Vitals:   04/10/21 1330 04/10/21 1400  BP: 133/86 (!) 146/83  Pulse: 62 67  Resp: 10 18  Temp:  (!) 36.1 C  SpO2: 95% 100%    Last Pain:  Vitals:   04/10/21 1400  TempSrc:   PainSc: 0-No pain                 Arita Miss

## 2021-04-10 NOTE — Progress Notes (Signed)
Per dr Rory Percy pt ok for discharge based on negative MRI for acute CVA.  Dr Bertell Maria ok with pt discharge since pt remains hemodynamically stable.

## 2021-04-11 ENCOUNTER — Encounter: Payer: Self-pay | Admitting: Surgery

## 2021-04-16 ENCOUNTER — Ambulatory Visit: Payer: Self-pay | Admitting: General Surgery

## 2021-04-18 ENCOUNTER — Encounter: Payer: Self-pay | Admitting: Surgery

## 2021-04-18 ENCOUNTER — Ambulatory Visit (INDEPENDENT_AMBULATORY_CARE_PROVIDER_SITE_OTHER): Payer: Medicare HMO | Admitting: Surgery

## 2021-04-18 ENCOUNTER — Other Ambulatory Visit: Payer: Self-pay

## 2021-04-18 VITALS — BP 130/77 | HR 93 | Temp 97.9°F | Ht 66.0 in | Wt 165.2 lb

## 2021-04-18 DIAGNOSIS — Z8719 Personal history of other diseases of the digestive system: Secondary | ICD-10-CM

## 2021-04-18 DIAGNOSIS — Z9889 Other specified postprocedural states: Secondary | ICD-10-CM

## 2021-04-18 NOTE — Progress Notes (Signed)
[facility] POST-OP OFFICE VISIT  04/18/2021  HPI: Jamie Mccoy is a 80 y.o. male 8 days s/p robotic bilateral inguinal hernia repairs.  Biggest concerns today are of a band like ecchymosis aligning his 3 incisions, and the blackened discoloration of his genitalia and mid suprapubic pubic area.  He also does not have the appetite that his wife feels he should have though he is having normal bowel activity.  Denies nausea, vomiting, fevers and chills.  He has only been taking Tylenol for pain.   Vital signs: BP 130/77   Pulse 93   Temp 97.9 F (36.6 C) (Oral)   Ht 5\' 6"  (1.676 m)   Wt 165 lb 3.2 oz (74.9 kg)   SpO2 97%   BMI 26.66 kg/m    Physical Exam: Constitutional: He appears at his baseline, bright and involved with the discussion.  He continues to show some degree of repetition/over agreement with whoever is talking. Abdomen: Benign soft nontender, no evidence of hematoma, mass or fascial defect.  Groins are intact. Skin: Ecchymotic band across the incision line of the epigastrium, isolated ecchymosis involving the genitalia and the mid external pelvis.  No extension of ecchymosis on to either thigh.  No erythema, no mass, hematoma or pseudohernia present.  Assessment/Plan: This is a 80 y.o. male 8 days s/p robotic bilateral inguinal hernia repairs.  Patient Active Problem List   Diagnosis Date Noted   Left inguinal hernia 04/02/2021    -Suboptimal appetite postop, expect this to resolve. Postoperative ecchymosis, consistent with patient's age.  No evidence of hematoma formation.  Part of her postoperative course.  We will have him follow-up in 3 weeks or as needed.   Ronny Bacon M.D., FACS 04/18/2021, 10:02 AM

## 2021-04-18 NOTE — Patient Instructions (Signed)
If you have any concerns or questions, please feel free to call our office. Follow up as needed.    GENERAL POST-OPERATIVE PATIENT INSTRUCTIONS   WOUND CARE INSTRUCTIONS:  Keep a dry clean dressing on the wound if there is drainage. The initial bandage may be removed after 24 hours.  Once the wound has quit draining you may leave it open to air.  If clothing rubs against the wound or causes irritation and the wound is not draining you may cover it with a dry dressing during the daytime.  Try to keep the wound dry and avoid ointments on the wound unless directed to do so.  If the wound becomes bright red and painful or starts to drain infected material that is not clear, please contact your physician immediately.  If the wound is mildly pink and has a thick firm ridge underneath it, this is normal, and is referred to as a healing ridge.  This will resolve over the next 4-6 weeks.  BATHING: You may shower if you have been informed of this by your surgeon. However, Please do not submerge in a tub, hot tub, or pool until incisions are completely sealed or have been told by your surgeon that you may do so.  DIET:  You may eat any foods that you can tolerate.  It is a good idea to eat a high fiber diet and take in plenty of fluids to prevent constipation.  If you do become constipated you may want to take a mild laxative or take ducolax tablets on a daily basis until your bowel habits are regular.  Constipation can be very uncomfortable, along with straining, after recent surgery.  ACTIVITY:  You are encouraged to cough and deep breath or use your incentive spirometer if you were given one, every 15-30 minutes when awake.  This will help prevent respiratory complications and low grade fevers post-operatively if you had a general anesthetic.  You may want to hug a pillow when coughing and sneezing to add additional support to the surgical area, if you had abdominal or chest surgery, which will decrease pain  during these times.  You are encouraged to walk and engage in light activity for the next two weeks.  You should not lift more than 15 pounds, until 05/22/2021 as it could put you at increased risk for complications.  Twenty pounds is roughly equivalent to a plastic bag of groceries. At that time- Listen to your body when lifting, if you have pain when lifting, stop and then try again in a few days. Soreness after doing exercises or activities of daily living is normal as you get back in to your normal routine.  MEDICATIONS:  Try to take narcotic medications and anti-inflammatory medications, such as tylenol, ibuprofen, naprosyn, etc., with food.  This will minimize stomach upset from the medication.  Should you develop nausea and vomiting from the pain medication, or develop a rash, please discontinue the medication and contact your physician.  You should not drive, make important decisions, or operate machinery when taking narcotic pain medication.  SUNBLOCK Use sun block to incision area over the next year if this area will be exposed to sun. This helps decrease scarring and will allow you avoid a permanent darkened area over your incision.

## 2021-05-09 ENCOUNTER — Ambulatory Visit (INDEPENDENT_AMBULATORY_CARE_PROVIDER_SITE_OTHER): Payer: Medicare HMO | Admitting: Surgery

## 2021-05-09 ENCOUNTER — Other Ambulatory Visit: Payer: Self-pay

## 2021-05-09 ENCOUNTER — Encounter: Payer: Self-pay | Admitting: Surgery

## 2021-05-09 VITALS — BP 107/73 | HR 70 | Temp 98.5°F | Ht 67.0 in | Wt 164.8 lb

## 2021-05-09 DIAGNOSIS — Z8719 Personal history of other diseases of the digestive system: Secondary | ICD-10-CM

## 2021-05-09 DIAGNOSIS — Z9889 Other specified postprocedural states: Secondary | ICD-10-CM

## 2021-05-09 NOTE — Progress Notes (Signed)
[facility] POST-OP OFFICE VISIT  05/09/2021  HPI: Quentrell Rodeman is a 80 y.o. male 3 weeks s/p robotic inguinal hernia repair.  This kind gentleman has no complaints of pain.  I am not sure he ever complains.  He reportedly has an anxious to get out and about, included mowing the lawn and other activities he appears to enjoy.  I believe he is ready and able to resume the activities he enjoys participating and without any risk at this time.  Vital signs: BP 107/73   Pulse 70   Temp 98.5 F (36.9 C) (Oral)   Ht '5\' 7"'$  (1.702 m)   Wt 164 lb 12.8 oz (74.8 kg)   SpO2 94%   BMI 25.81 kg/m    Physical Exam: Constitutional: Appears well, clearly back to his baseline. Abdomen: Benign, soft nontender. Skin: Incisions are clean dry and intact.  There is no groin swelling or mass whatsoever.  Assessment/Plan: This is a 80 y.o. male 3 weeks s/p robotic bilateral inguinal hernia repairs.  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   Visual changes 10/30/2020   Personal history of stroke with residual effects 06/28/2020   Lower urinary tract symptoms (LUTS) 04/12/2020   History of meningioma 07/05/2017   Gastroesophageal reflux disease with esophagitis 09/09/2016   Allergic rhinitis 02/21/2014   Encounter for subsequent annual wellness visit (AWV) in Medicare patient 08/23/2013   Hyperlipidemia 08/23/2013   Impaired fasting glucose 08/23/2013   Arthritis of knee 06/14/2013   Barrett's esophagus 06/14/2013   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  This was discussed with his wife and his granddaughter.   Ronny Bacon M.D.,  FACS 05/09/2021, 3:04 PM

## 2021-05-09 NOTE — Patient Instructions (Signed)

## 2021-05-22 ENCOUNTER — Other Ambulatory Visit: Payer: Self-pay

## 2021-05-22 ENCOUNTER — Ambulatory Visit: Payer: Medicare HMO | Attending: Internal Medicine | Admitting: Speech Pathology

## 2021-05-22 DIAGNOSIS — R41841 Cognitive communication deficit: Secondary | ICD-10-CM | POA: Insufficient documentation

## 2021-05-23 NOTE — Therapy (Signed)
[place] MAIN [facility] SERVICES [address] [place], [place], [address] Phone: [phone]   Fax:  [phone]  Speech Language Pathology Evaluation  Patient Details  Name: Jamie Mccoy MRN: [id number] Date of Birth: Dec 31, 1940 Referring Provider (SLP): Audelia Acton  Encounter Date: 05/22/2021   End of Session - 05/23/21 2347     Visit Number 1    Number of Visits 1    Authorization Type [employer]    SLP Start Time 1500    SLP Stop Time  1700    SLP Time Calculation (min) 120 min    Activity Tolerance Patient tolerated treatment well             Past Medical History:  Diagnosis Date   Actinic keratosis    Arthritis    Barrett esophagus    Cancer ([place])    PROSTATE   Colon polyps    Gastroesophageal reflux    H/O benign neoplasm of cerebral meninges    Hard of hearing    Hypertension    Stroke ([place]) 01/2020   Tinnitus     Past Surgical History:  Procedure Laterality Date   BRAIN SURGERY     EYE SURGERY Left    PROSTATE SURGERY     SEED IMPLANTS   TOTAL KNEE ARTHROPLASTY Left    XI ROBOTIC ASSISTED INGUINAL HERNIA REPAIR WITH MESH Bilateral 04/10/2021   Procedure: XI ROBOTIC ASSISTED INGUINAL HERNIA REPAIR WITH MESH;  Surgeon: Ronny Bacon, MD;  Location: [facility] ORS;  Service: General;  Laterality: Bilateral;    There were no vitals filed for this visit.   Subjective Assessment - 05/23/21 2329     Subjective "There ain't nothing wrong with me, I say whatever I want, she says there is something wrong with me but there aint"    Patient is accompained by: Family member    Currently in Pain? 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      Cognition   Overall Cognitive Status Impaired/Different from baseline    Area of Impairment Orientation;Attention;Memory;Safety/judgement;Awareness;Problem solving    Orientation Level Situation;Place    Current Attention Level Sustained    Memory Decreased short-term memory    Safety/Judgement Decreased awareness of deficits;Decreased awareness of safety    Awareness Intellectual    Attention Sustained    Sustained Attention Impaired    Sustained Attention Impairment Verbal complex;Functional complex    Memory Impaired    Memory Impairment Storage deficit;Retrieval deficit;Decreased recall of new information;Decreased short term memory;Prospective memory    Decreased Short Term Memory Verbal basic;Functional basic    Awareness Impaired    Awareness Impairment Intellectual impairment    Problem Solving Impaired    Problem Solving Impairment Verbal basic;Functional basic    Executive Function --   all impaired by lower level deficits   Behaviors Impulsive;Verbal agitation;Perseveration;Poor frustration tolerance      Auditory Comprehension   Overall Auditory Comprehension Impaired    Yes/No Questions Impaired    Basic Biographical Questions 51-75% accurate    Basic Immediate  Environment Questions 50-74% accurate    Paragraph Comprehension (via yes/no questions) 51-75% accurate    Commands Impaired    One Step Basic Commands 75-100% accurate    Two Step Basic Commands 50-74% accurate    Multistep Basic Commands 0-24% accurate    Conversation Simple    Interfering Components Attention;Working Recruitment consultant Not tested      Reading Comprehension   Reading Status Within funtional limits      Expression   Primary Mode of Expression Verbal      Verbal Expression   Overall Verbal Expression Impaired    Initiation No impairment    Automatic Speech Name;Social Response    Level of  Generative/Spontaneous Verbalization Conversation    Repetition No impairment    Naming No impairment    Pragmatics Impairment    Impairments Abnormal affect;Eye contact;Interpretation of nonverbal communication;Topic appropriateness;Topic maintenance;Turn Taking    Non-Verbal Means of Communication Not applicable      Written Expression   Dominant Hand Right    Written Expression Within Functional Limits      Oral Motor/Sensory Function   Overall Oral Motor/Sensory Function Appears within functional limits for tasks assessed      Motor Speech   Overall Motor Speech Appears within functional limits for tasks assessed      Standardized Assessments   Standardized Assessments  Cognitive Linguistic Quick Test               SLP Education - 05/23/21 2346     Education Details results of evaluation    Person(s) Educated Patient;Spouse    Methods Explanation;Demonstration;Verbal cues;Handout    Comprehension Verbalized understanding                  Plan - 05/23/21 2349     Clinical Impression Statement Pt presents with 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 In addition to these cognitive deficits, pt presents with poor task tolerance, decrease mental flexibility, deflection of deficits onto his wife.   Pt presents with a moderate cognitive impairment that is excerbated by pt's behaviors of irritability, distrust, decreased mental flexibility, and poor task tolerance. Pt's cognitive deficits were further confirmed by his scores on the Cognitive Linguistic Quick Test. The Cognitive Linguistic Quick Test (CLQT) was administered to assess the relative status of five cognitive domains: attention, memory, language, executive functioning, and visuospatial skills. Scores from 10 tasks were used to estimate severity ratings (for age groups 18-69 years and 70-89 years) for each domain, a clock drawing task, as well as an  overall composite severity rating of cognition. Pt obtained the following composite severity ratings: ATTENTION minimal, MEMORY moderate, EXECUTIVE FUNCTIONS moderate, LANGUAGE mild, VISUOSPATIAL SKILLS mild, CLOCK DRAWING severe. Subtests were scored with pt present and while pt recognized that his responses were in error, he was agrumentative and hyperfocused on deflecting deficits onto his wife and therapist. He  There were times during the evaluation that pt demonstrated an inability to use or understand language. He was intermittently nonfluent. But don't suspect this is related to a true aphasia but may be attributed to a larger cognitive disorder.       In addition to the above mentioned cognitive deficits and language problems, pt's wife voices concern regarding changes in pt's personality. She reports that pt has mood changes, loss of all motivation and interest in people or doing things, he is cold and selfish, insensitive/rude, inflexible,  and easily agitated. She also reports physical symptoms of poor coordination/balanace.       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       Unfortunately, skilled ST intervention is not indicated.    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   Visual changes 10/30/2020   Personal history of stroke with residual effects 06/28/2020   Lower urinary tract symptoms (LUTS) 04/12/2020   History of meningioma 07/05/2017   Gastroesophageal reflux disease with esophagitis 09/09/2016   Allergic rhinitis 02/21/2014   Encounter for subsequent annual wellness visit (AWV) in Medicare patient 08/23/2013   Hyperlipidemia 08/23/2013   Impaired fasting glucose 08/23/2013   Arthritis of knee 06/14/2013   Barrett's esophagus 06/14/2013   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 Rutherford Nail M.S., CCC-SLP, [place] Pathologist Rehabilitation Services Office [phone]  Stormy Fabian 05/23/2021, 11:51 PM  [place] MAIN [facility] SERVICES [address] [place], [place], [address] Phone: [phone]   Fax:  [phone]  Name: Jamie Mccoy MRN: [id number] Date of Birth: 03-05-1941

## 2021-07-19 ENCOUNTER — Encounter: Payer: Self-pay | Admitting: General Surgery

## 2022-01-14 ENCOUNTER — Other Ambulatory Visit: Payer: Self-pay | Admitting: Podiatry

## 2022-01-14 DIAGNOSIS — M86172 Other acute osteomyelitis, left ankle and foot: Secondary | ICD-10-CM

## 2022-01-14 DIAGNOSIS — L97524 Non-pressure chronic ulcer of other part of left foot with necrosis of bone: Secondary | ICD-10-CM

## 2022-01-15 ENCOUNTER — Ambulatory Visit
Admission: RE | Admit: 2022-01-15 | Discharge: 2022-01-15 | Disposition: A | Discharge status: Home / Self Care | Payer: Medicare HMO | Source: Ambulatory Visit | Attending: Podiatry | Admitting: Podiatry

## 2022-01-15 ENCOUNTER — Other Ambulatory Visit: Payer: Self-pay | Admitting: Podiatry

## 2022-01-15 DIAGNOSIS — M86172 Other acute osteomyelitis, left ankle and foot: Secondary | ICD-10-CM | POA: Diagnosis present

## 2022-01-15 DIAGNOSIS — L97524 Non-pressure chronic ulcer of other part of left foot with necrosis of bone: Secondary | ICD-10-CM | POA: Diagnosis present

## 2022-01-16 ENCOUNTER — Encounter: Payer: Self-pay | Admitting: Podiatry

## 2022-01-16 NOTE — Discharge Instructions (Addendum)
[place] ?[place] ? ?POST OPERATIVE INSTRUCTIONS FOR DR. Vickki Muff AND DR. Vila Dory ?[place] ? ? ?Take your medication as prescribed.  Pain medication should be taken only as needed. ? ?Keep the dressing clean, dry and intact. ? ?Keep your foot elevated above the heart level for the first 48 hours. ? ?Walking to the bathroom and brief periods of walking are acceptable, unless we have instructed you to be non-weight bearing. ? ?Always wear your post-op shoe when walking.  Always use your crutches if you are to be non-weight bearing.  Try to weight-bear on your left heel with the surgical shoe on.  Try to avoid pressure directly to the left forefoot in the area of the procedure site. ? ?Do not take a shower. Baths are permissible as long as the foot is kept out of the water.  ? ?Every hour you are awake:  ?Bend your knee 15 times. ?Flex foot 15 times ?Massage calf 15 times ? ?Call [facility] [phone]) if any of the following problems occur: ?You develop a temperature or fever. ?The bandage becomes saturated with blood. ?Medication does not stop your pain. ?Injury of the foot occurs. ?Any symptoms of infection including redness, odor, or red streaks running from wound. ?

## 2022-01-23 ENCOUNTER — Other Ambulatory Visit: Payer: Self-pay

## 2022-01-23 ENCOUNTER — Ambulatory Visit: Payer: Medicare HMO | Admitting: Anesthesiology

## 2022-01-23 ENCOUNTER — Encounter
Admission: RE | Disposition: A | Discharge status: Home / Self Care | Payer: Self-pay | Source: Ambulatory Visit | Attending: Podiatry

## 2022-01-23 ENCOUNTER — Ambulatory Visit
Admission: RE | Admit: 2022-01-23 | Discharge: 2022-01-23 | Disposition: A | Discharge status: Home / Self Care | Payer: Medicare HMO | Source: Ambulatory Visit | Attending: Podiatry | Admitting: Podiatry

## 2022-01-23 ENCOUNTER — Encounter: Payer: Self-pay | Admitting: Podiatry

## 2022-01-23 DIAGNOSIS — E1169 Type 2 diabetes mellitus with other specified complication: Secondary | ICD-10-CM | POA: Diagnosis not present

## 2022-01-23 DIAGNOSIS — Z7984 Long term (current) use of oral hypoglycemic drugs: Secondary | ICD-10-CM | POA: Insufficient documentation

## 2022-01-23 DIAGNOSIS — Z8673 Personal history of transient ischemic attack (TIA), and cerebral infarction without residual deficits: Secondary | ICD-10-CM | POA: Insufficient documentation

## 2022-01-23 DIAGNOSIS — E1142 Type 2 diabetes mellitus with diabetic polyneuropathy: Secondary | ICD-10-CM | POA: Insufficient documentation

## 2022-01-23 DIAGNOSIS — M869 Osteomyelitis, unspecified: Secondary | ICD-10-CM | POA: Diagnosis not present

## 2022-01-23 DIAGNOSIS — I10 Essential (primary) hypertension: Secondary | ICD-10-CM | POA: Insufficient documentation

## 2022-01-23 DIAGNOSIS — L97524 Non-pressure chronic ulcer of other part of left foot with necrosis of bone: Secondary | ICD-10-CM | POA: Insufficient documentation

## 2022-01-23 DIAGNOSIS — K219 Gastro-esophageal reflux disease without esophagitis: Secondary | ICD-10-CM | POA: Insufficient documentation

## 2022-01-23 DIAGNOSIS — M86172 Other acute osteomyelitis, left ankle and foot: Secondary | ICD-10-CM

## 2022-01-23 DIAGNOSIS — E11621 Type 2 diabetes mellitus with foot ulcer: Secondary | ICD-10-CM | POA: Insufficient documentation

## 2022-01-23 HISTORY — DX: Presence of external hearing-aid: Z97.4

## 2022-01-23 HISTORY — PX: AMPUTATION TOE: SHX6595

## 2022-01-23 HISTORY — DX: Other amnesia: R41.3

## 2022-01-23 HISTORY — DX: Presence of dental prosthetic device (complete) (partial): Z97.2

## 2022-01-23 HISTORY — DX: Weakness: R53.1

## 2022-01-23 SURGERY — AMPUTATION, TOE
Anesthesia: General | Site: Toe | Laterality: Left

## 2022-01-23 MED ORDER — 0.9 % SODIUM CHLORIDE (POUR BTL) OPTIME
TOPICAL | Status: DC | PRN
  Administered 2022-01-23: 500 mL

## 2022-01-23 MED ORDER — ONDANSETRON HCL 4 MG/2ML IJ SOLN: 4.0000 mg | Freq: Once | INTRAMUSCULAR | Status: DC | PRN

## 2022-01-23 MED ORDER — FENTANYL CITRATE (PF) 100 MCG/2ML IJ SOLN
INTRAMUSCULAR | Status: DC | PRN
  Administered 2022-01-23 (×2): 25 ug via INTRAVENOUS

## 2022-01-23 MED ORDER — PROPOFOL 500 MG/50ML IV EMUL
INTRAVENOUS | Status: DC | PRN
  Administered 2022-01-23: 50 ug/kg/min via INTRAVENOUS

## 2022-01-23 MED ORDER — FENTANYL CITRATE PF 50 MCG/ML IJ SOSY: 25.0000 ug | PREFILLED_SYRINGE | INTRAMUSCULAR | Status: DC | PRN

## 2022-01-23 MED ORDER — LACTATED RINGERS IV SOLN: INTRAVENOUS | Status: DC

## 2022-01-23 MED ORDER — ACETAMINOPHEN 325 MG PO TABS: 325.0000 mg | ORAL_TABLET | ORAL | Status: DC | PRN

## 2022-01-23 MED ORDER — ACETAMINOPHEN 160 MG/5ML PO SOLN: 325.0000 mg | ORAL | Status: DC | PRN

## 2022-01-23 MED ORDER — LIDOCAINE HCL (CARDIAC) PF 100 MG/5ML IV SOSY
PREFILLED_SYRINGE | INTRAVENOUS | Status: DC | PRN
  Administered 2022-01-23: 30 mg via INTRAVENOUS

## 2022-01-23 MED ORDER — CEFAZOLIN SODIUM-DEXTROSE 2-4 GM/100ML-% IV SOLN
2.0000 g | INTRAVENOUS | Status: AC
  Administered 2022-01-23: 2 g via INTRAVENOUS

## 2022-01-23 MED ORDER — OXYCODONE HCL 5 MG PO TABS: 5.0000 mg | ORAL_TABLET | Freq: Once | ORAL | Status: DC | PRN

## 2022-01-23 MED ORDER — OXYCODONE HCL 5 MG/5ML PO SOLN: 5.0000 mg | Freq: Once | ORAL | Status: DC | PRN

## 2022-01-23 MED ORDER — BUPIVACAINE HCL 0.5 % IJ SOLN
INTRAMUSCULAR | Status: DC | PRN
  Administered 2022-01-23 (×2): 10 mL

## 2022-01-23 MED ORDER — HYDROCODONE-ACETAMINOPHEN 5-325 MG PO TABS: 1.0000 | ORAL_TABLET | Freq: Four times a day (QID) | ORAL | 0 refills | Status: AC | PRN

## 2022-01-23 SURGICAL SUPPLY — 26 items
BNDG CONFORM 3 STRL LF (GAUZE/BANDAGES/DRESSINGS) ×2 IMPLANT
BNDG ELASTIC 4X5.8 VLCR STR LF (GAUZE/BANDAGES/DRESSINGS) ×2 IMPLANT
BNDG ESMARK 4X12 TAN STRL LF (GAUZE/BANDAGES/DRESSINGS) ×2 IMPLANT
BNDG GAUZE ELAST 4 BULKY (GAUZE/BANDAGES/DRESSINGS) ×2 IMPLANT
CANISTER SUCT 1200ML W/VALVE (MISCELLANEOUS) ×2 IMPLANT
CUFF TOURN SGL QUICK 18X4 (TOURNIQUET CUFF) IMPLANT
DURAPREP 26ML APPLICATOR (WOUND CARE) ×2 IMPLANT
ELECT REM PT RETURN 9FT ADLT (ELECTROSURGICAL) ×2
ELECTRODE REM PT RTRN 9FT ADLT (ELECTROSURGICAL) ×1 IMPLANT
GAUZE SPONGE 4X4 12PLY STRL (GAUZE/BANDAGES/DRESSINGS) ×2 IMPLANT
GAUZE XEROFORM 1X8 LF (GAUZE/BANDAGES/DRESSINGS) ×2 IMPLANT
GLOVE SURG ENC MOIS LTX SZ7 (GLOVE) ×2 IMPLANT
GLOVE SURG POLYISO LF SZ7 (GLOVE) ×2 IMPLANT
GLOVE SURG UNDER POLY LF SZ7 (GLOVE) ×2 IMPLANT
GOWN STRL REUS W/ TWL LRG LVL3 (GOWN DISPOSABLE) ×2 IMPLANT
GOWN STRL REUS W/TWL LRG LVL3 (GOWN DISPOSABLE) ×4
KIT TURNOVER KIT A (KITS) ×2 IMPLANT
NS IRRIG 500ML POUR BTL (IV SOLUTION) ×2 IMPLANT
PACK EXTREMITY (MISCELLANEOUS) ×2 IMPLANT
SOL PREP PVP 2OZ (MISCELLANEOUS) ×2
SOLUTION PREP PVP 2OZ (MISCELLANEOUS) ×1 IMPLANT
STOCKINETTE STRL 6IN 960660 (GAUZE/BANDAGES/DRESSINGS) ×2 IMPLANT
SUT ETHILON 3-0 (SUTURE) ×1 IMPLANT
SUT VIC AB 3-0 SH 27 (SUTURE) ×2
SUT VIC AB 3-0 SH 27X BRD (SUTURE) IMPLANT
SYR 10ML LL (SYRINGE) ×2 IMPLANT

## 2022-01-23 NOTE — Anesthesia Postprocedure Evaluation (Signed)
Anesthesia Post Note ? ?Patient: Jamie Mccoy ? ?Procedure(s) Performed: AMPUTATION TOE METATARSOPHALANGEAL JOINT (Left: Toe) ? ? ?  ?Patient location during evaluation: PACU ?Anesthesia Type: General ?Level of consciousness: awake and alert and oriented ?Pain management: satisfactory to patient ?Vital Signs Assessment: post-procedure vital signs reviewed and stable ?Respiratory status: spontaneous breathing, nonlabored ventilation and respiratory function stable ?Cardiovascular status: blood pressure returned to baseline and stable ?Postop Assessment: Adequate PO intake and No signs of nausea or vomiting ?Anesthetic complications: no ? ? ?No notable events documented. ? ?Raliegh Ip ? ? ? ? ? ?

## 2022-01-23 NOTE — H&P (Signed)
HISTORY AND PHYSICAL INTERVAL NOTE: ? ?01/23/2022 ? ?11:16 AM ? 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  The various methods of treatment have been discussed with the patient.  No guarantees were given.  After consideration of risks, benefits and other options for treatment, the patient has consented to surgery.  I have reviewed the patients? chart and labs.   ? ?PROCEDURE: ?LEFT 2ND TOE AMPUTATION ? ?A history and physical examination was performed in my office.  The patient was reexamined.  There have been no changes to this history and physical examination. ? ?Caroline More, DPM ? ?

## 2022-01-23 NOTE — Transfer of Care (Signed)
Immediate Anesthesia Transfer of Care Note ? ?Patient: Jamie Mccoy ? ?Procedure(s) Performed: AMPUTATION TOE METATARSOPHALANGEAL JOINT (Left: Toe) ? ?Patient Location: PACU ? ?Anesthesia Type: General LMA ? ?Level of Consciousness: awake, alert  and patient cooperative ? ?Airway and Oxygen Therapy: Patient Spontanous Breathing and Patient connected to supplemental oxygen ? ?Post-op Assessment: Post-op Vital signs reviewed, Patient's Cardiovascular Status Stable, Respiratory Function Stable, Patent Airway and No signs of Nausea or vomiting ? ?Post-op Vital Signs: Reviewed and stable ? ?Complications: No notable events documented. ? ?

## 2022-01-23 NOTE — Anesthesia Preprocedure Evaluation (Signed)
Anesthesia Evaluation  ?Patient identified by MRN, date of birth, ID band ?Patient awake ? ? ? ?Reviewed: ?Allergy & Precautions, H&P , NPO status , Patient's Chart, lab work & pertinent test results ? ?Airway ?Mallampati: II ? ?TM Distance: >3 FB ?Neck ROM: full ? ? ? Dental ?no notable dental hx. ?(+) Missing ?  ?Pulmonary ? ?  ?Pulmonary exam normal ?breath sounds clear to auscultation ? ? ? ? ? ? Cardiovascular ?hypertension, Normal cardiovascular exam ?Rhythm:regular Rate:Normal ? ? ?  ?Neuro/Psych ?PSYCHIATRIC DISORDERS CVA, Residual Symptoms   ? GI/Hepatic ?GERD  ,  ?Endo/Other  ? ? Renal/GU ?  ? ?  ?Musculoskeletal ? ? Abdominal ?  ?Peds ? Hematology ?  ?Anesthesia Other Findings ? ? Reproductive/Obstetrics ? ?  ? ? ? ? ? ? ? ? ? ? ? ? ? ?  ?  ? ? ? ? ? ? ? ? ?Anesthesia Physical ?Anesthesia Plan ? ?ASA: 2 ? ?Anesthesia Plan: General LMA  ? ?Post-op Pain Management: Minimal or no pain anticipated  ? ?Induction:  ? ?PONV Risk Score and Plan: 2 and Treatment may vary due to age or medical condition and Ondansetron ? ?Airway Management Planned:  ? ?Additional Equipment:  ? ?Intra-op Plan:  ? ?Post-operative Plan:  ? ?Informed Consent: I have reviewed the patients History and Physical, chart, labs and discussed the procedure including the risks, benefits and alternatives for the proposed anesthesia with the patient or authorized representative who has indicated his/her understanding and acceptance.  ? ? ? ?Dental Advisory Given ? ?Plan Discussed with: CRNA ? ?Anesthesia Plan Comments:   ? ? ? ? ? ? ?Anesthesia Quick Evaluation ? ?

## 2022-01-23 NOTE — Anesthesia Procedure Notes (Signed)
Procedure Name: General with mask airway ?Date/Time: 01/23/2022 11:42 AM ?Performed by: Jeannene Patella, CRNA ?Pre-anesthesia Checklist: Patient identified, Emergency Drugs available, Suction available, Patient being monitored and Timeout performed ?Patient Re-evaluated:Patient Re-evaluated prior to induction ?Oxygen Delivery Method: Simple face mask ?Placement Confirmation: positive ETCO2 and breath sounds checked- equal and bilateral ? ? ? ? ?

## 2022-01-23 NOTE — Op Note (Signed)
PODIATRY / FOOT AND ANKLE SURGERY OPERATIVE REPORT ? ? ? ?SURGEON: Caroline More, DPM ? ?PRE-OPERATIVE DIAGNOSIS:  ?1.  Left second toe osteomyelitis ? ?POST-OPERATIVE DIAGNOSIS: Same ? ?PROCEDURE(S): ?Left second toe amputation ? ?HEMOSTASIS: Left ankle tourniquet ? ?ANESTHESIA: MAC ? ?ESTIMATED BLOOD LOSS: 20 cc ? ?FINDING(S): ?1.  Left second toe PIPJ ulceration medially with bone exposed and corresponding erythema and edema ? ?PATHOLOGY/SPECIMEN(S): Left second toe proximal margin in purple ink ? ?INDICATIONS:   ?Jamie Mccoy is a 81 y.o. male who presents with a nonhealing ulceration to the medial aspect the left second toe.  Patient was seen in clinic and noted to have a wound that probes to bone to the area.  X-ray imaging as well as MRI imaging did reveal osteomyelitis to the joint and a substantial portion of the left second toe overall.  Patient also grew MRSA on culture and has been placed on a series of antibiotics to cover the for this.  Patient is currently taking antibiotics now.  All treatment options were discussed with the patient both conservative and surgical attempts at correction including potential risks and complications at this time patient and family have elected for surgical intervention consisting of left second toe amputation.  No guarantees given.  Consent obtained prior to procedure. ? ?DESCRIPTION: ?After obtaining full informed written consent, the patient was brought back to the operating room and placed supine upon the operating table.  The patient received IV antibiotics prior to induction.  After obtaining adequate anesthesia, a preoperative block was performed with 20 cc of half percent Marcaine plain about the left second ray and second toe.  The patient was prepped and draped in the standard fashion.  An Esmarch bandage was used to exsanguinate the left lower extremity and the pneumatic ankle tourniquet was inflated. ? ?Attention was directed to the left second toe where an  incision was marked out to make an elliptical incision around the base of the proximal phalanx of the second toe around the second metatarsal phalangeal joint with arms extending proximal dorsal and proximal plantar.  The incision was made straight to bone.  A substantial amount of bleeding occurred with this so the pneumatic ankle tourniquet was deflated.  Bleeding appeared to be well under control and appeared to be more of a venous tourniquet.  Hemostasis was achieved with electrocauterization.  An extensor tenotomy and capsulotomy was performed followed by release the collateral and suspensory ligaments as well as any connection to the plantar plate and flexor tendon.  The second toe was disarticulated and passed off the operative site with the proximal margin marked in purple ink and sent off to pathology.  The extensor and flexor tendons were cut as far proximally as possible.  There did not appear to be any further evidence of osteomyelitis in the second metatarsal head as the cartilage appeared to be within normal limits with no changes.  The surgical site was flushed with copious amounts normal sterile saline.  The deep structures and deep fascia as well as subcutaneous tissue was reapproximated well coapted with 3-0 Vicryl.  The skin was then reapproximated well coapted with 3-0 nylon in a combination of simple and horizontal mattress type stitches.  A prompt hyperemic response was present to all digits of the left foot after the pneumatic ankle tourniquet was deflated.  A postoperative dressing was applied consisting of Xeroform followed by 4 x 4 gauze, Kerlix, Ace wrap.  The patient tolerated the procedure and anesthesia well was transferred  to recovery room vital signs stable.  Following a period of postoperative monitoring the patient will be discharged with the appropriate orders, medications, and instructions.  Patient is to remain partial weightbearing with heel contact and surgical shoe and to keep  dressing clean, dry, and intact until next visit. ? ?COMPLICATIONS: None ? ?CONDITION: Good, stable ? ?Caroline More, DPM ? ?

## 2022-01-24 ENCOUNTER — Encounter: Payer: Self-pay | Admitting: Podiatry

## 2022-07-26 IMAGING — MR MR FOOT*L* W/O CM
5 series · 40 of 40 positions shown · non-contrast
Comparison: None.

CLINICAL DATA: Cellulitis of second toe, left .K8Q.1Q1
([facility]) - Acute osteomyelitis of toe, left (HCC)
([facility]) - Neuropathic ulcer of toe, left, with necrosis of bone
(HCC) Marium toes of both feet. History of diabetes.

EXAM:
MRI OF THE LEFT FOOT WITHOUT CONTRAST
TECHNIQUE: Multiplanar, multisequence MR imaging of the left forefoot was
performed. No intravenous contrast was administered.

[Series 3: T1 · coronal · left · 3.0mm · 0.38mm/px · 12 of 45 slices shown (1 of 2)]
[im 1/45]
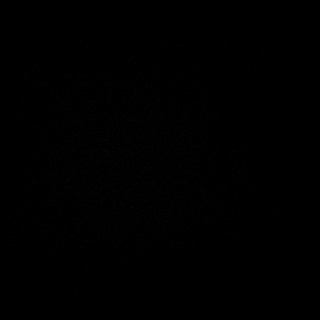
[im 5/45]
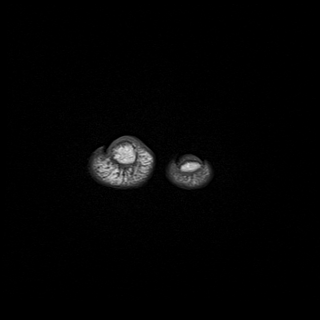
[im 9/45]
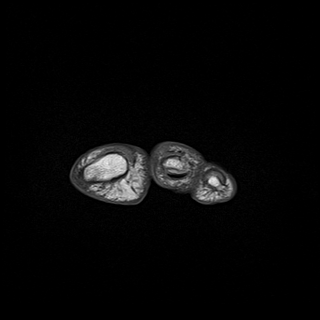
[im 13/45]
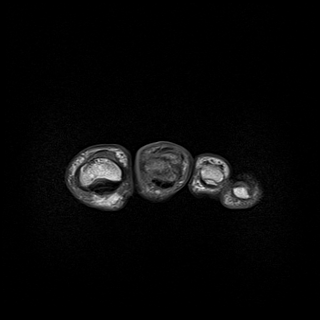
[im 17/45]
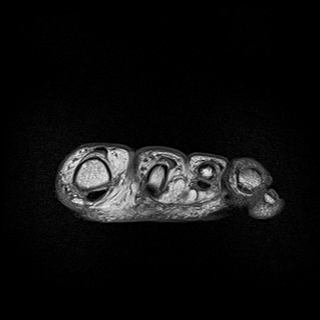
[im 21/45]
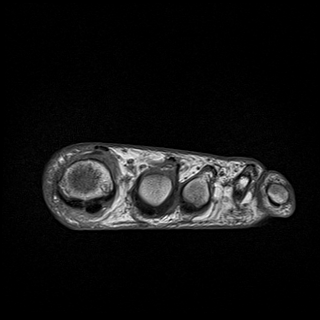
[im 25/45]
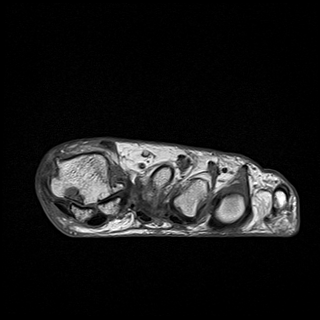
[im 29/45]
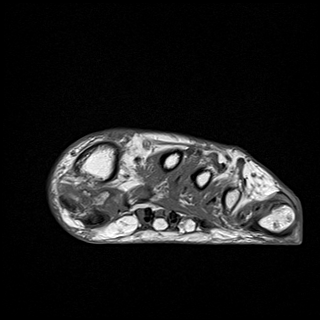
[im 33/45]
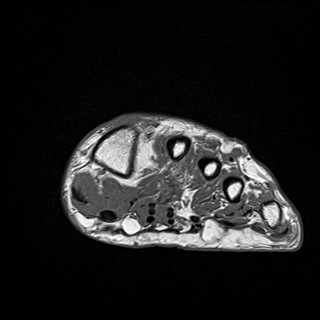
[im 37/45]
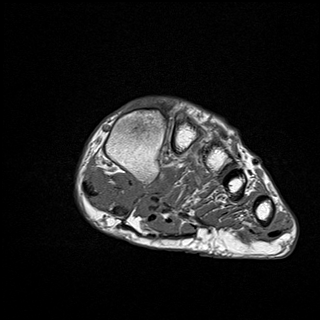
[im 41/45]
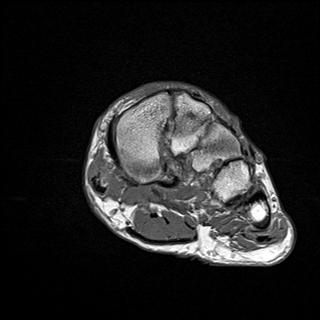
[im 45/45]
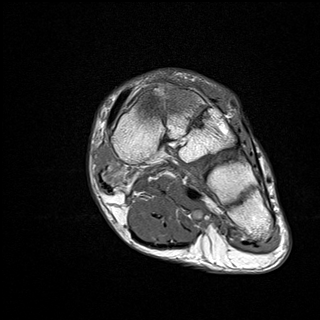

[Series 5: T2 · coronal · left · 3.0mm · 0.50mm/px · 11 of 44 slices shown (1 of 2)]
[im 1/44]
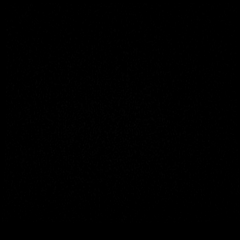
[im 5/44]
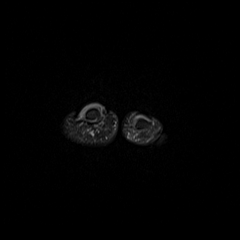
[im 9/44]
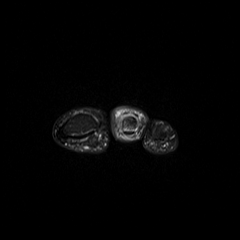
[im 13/44]
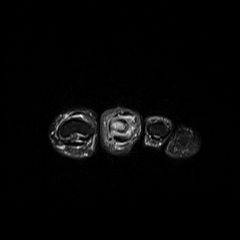
[im 18/44]
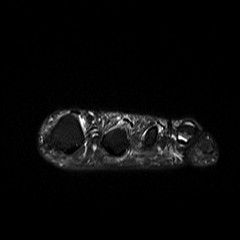
[im 22/44]
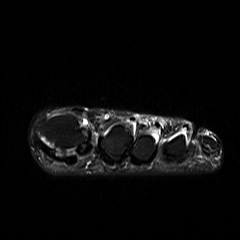
[im 26/44]
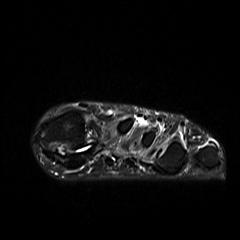
[im 31/44]
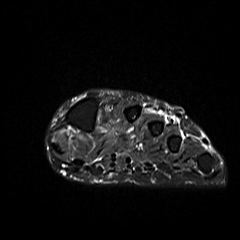
[im 35/44]
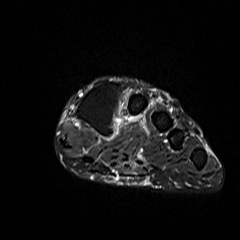
[im 39/44]
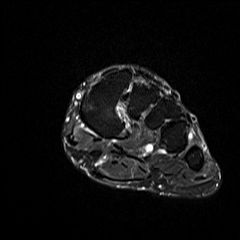
[im 44/44]
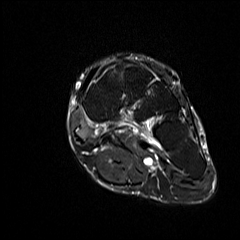

[Series 6: T1 · axial · left · 3.0mm · 0.70mm/px · z∈[-110,-41]mm · 5 of 20 slices shown (2 of 2)]
[im 1/20]
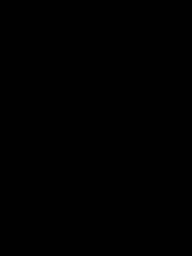
[im 5/20]
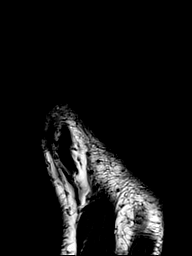
[im 10/20]
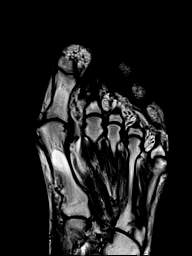
[im 15/20]
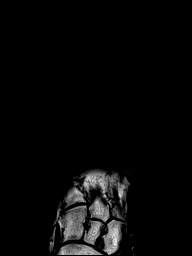
[im 20/20]
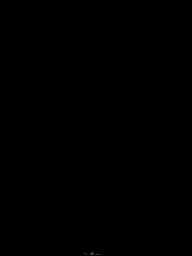

[Series 8: T2 · axial · left · 3.0mm · 0.70mm/px · z∈[-110,-41]mm · 5 of 20 slices shown (2 of 2)]
[im 1/20]
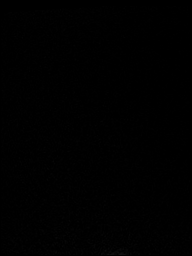
[im 5/20]
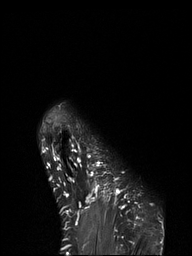
[im 10/20]
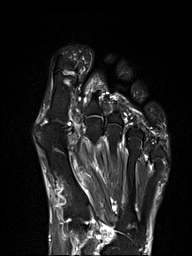
[im 15/20]
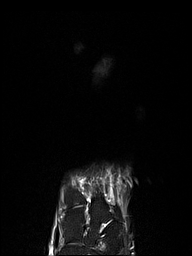
[im 20/20]
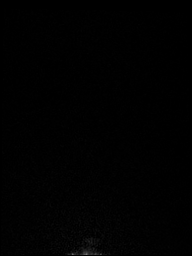

[Series 9: STIR · sagittal · left · 3.0mm · 0.62mm/px · 7 of 29 slices shown]
[im 1/29]
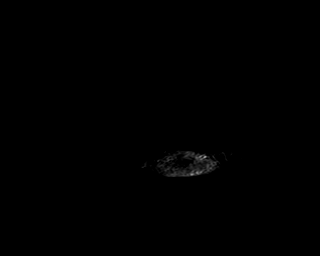
[im 5/29]
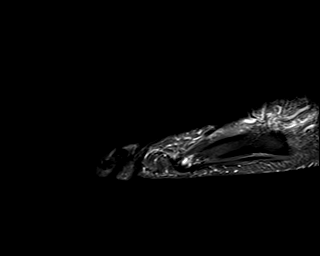
[im 10/29]
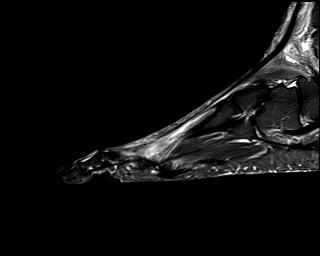
[im 15/29]
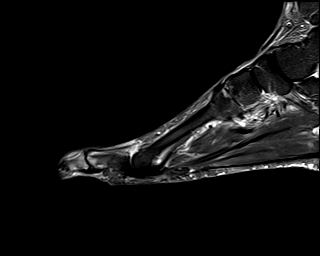
[im 19/29]
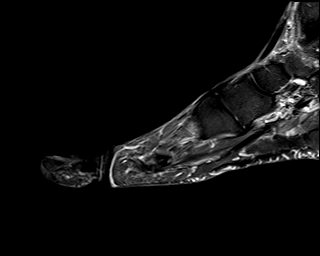
[im 24/29]
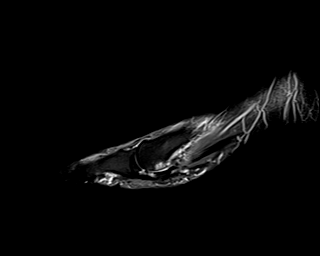
[im 29/29]
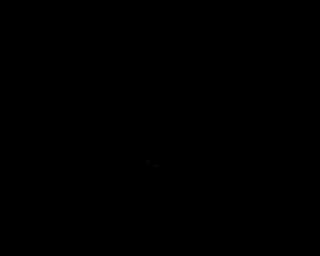

[40 of 40 positions shown; findings below may reference images not displayed]

FINDINGS: Bones/Joint/Cartilage

There is abnormally increased T2 and decreased T1 marrow signal
within the head of the 2nd proximal phalanx and base of the middle
phalanx. No gross cortical destruction identified. No significant
proximal interphalangeal joint effusion, although there is
surrounding soft tissue edema. No other suspicious marrow lesions.
There are mild degenerative changes at the 1st metatarsophalangeal
joint with a mild hallux valgus deformity.

Ligaments

The Lisfranc ligament is intact. The collateral ligaments of the
metatarsophalangeal joints appear intact.

Muscles and Tendons

The forefoot tendons appear intact. Mild tenosynovitis of the
extensor tendons of the 2nd and 3rd rays. The flexor tendons appear
unremarkable. No focal muscular abnormalities are seen.

Soft tissues

Soft tissue swelling with subcutaneous edema of the 2nd toe. No
focal fluid collection. No evidence of foreign body or soft tissue
emphysema.
IMPRESSION: 1. Soft tissue swelling of the 2nd toe with abnormal bone marrow
edema in the head of the proximal phalanx and base of the middle
phalanx, suspicious for osteomyelitis.
2. Probable extensor tenosynovitis of the 2nd and 3rd digits which
could be infectious.
3. No evidence of focal soft tissue abscess.
4. These results will be called to the ordering clinician or
representative by the Radiologist Assistant, and communication
documented in the PACS or [REDACTED].
